# Patient Record
Sex: Female | Born: 1985 | Race: White | Hispanic: No | Marital: Single | Smoking: Former smoker
Health system: Southern US, Community
[De-identification: ages and names within clinical notes are randomized; demographics above are authoritative.]

## PROBLEM LIST (undated history)

## (undated) DIAGNOSIS — F32A Depression, unspecified: Secondary | ICD-10-CM

## (undated) DIAGNOSIS — G43909 Migraine, unspecified, not intractable, without status migrainosus: Secondary | ICD-10-CM

## (undated) DIAGNOSIS — D509 Iron deficiency anemia, unspecified: Secondary | ICD-10-CM

## (undated) DIAGNOSIS — F419 Anxiety disorder, unspecified: Secondary | ICD-10-CM

## (undated) DIAGNOSIS — F329 Major depressive disorder, single episode, unspecified: Secondary | ICD-10-CM

## (undated) DIAGNOSIS — IMO0002 Reserved for concepts with insufficient information to code with codable children: Secondary | ICD-10-CM

## (undated) HISTORY — PX: WISDOM TOOTH EXTRACTION: SHX21

---
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
## 2006-04-03 ENCOUNTER — Other Ambulatory Visit: Admission: RE | Admit: 2006-04-03 | Discharge: 2006-04-03 | Payer: Self-pay | Admitting: Internal Medicine

## 2008-06-05 ENCOUNTER — Emergency Department (HOSPITAL_COMMUNITY): Admission: EM | Admit: 2008-06-05 | Discharge: 2008-06-05 | Payer: Self-pay | Admitting: Emergency Medicine

## 2008-06-06 ENCOUNTER — Inpatient Hospital Stay (HOSPITAL_COMMUNITY): Admission: AD | Admit: 2008-06-06 | Discharge: 2008-06-06 | Payer: Self-pay | Admitting: Obstetrics & Gynecology

## 2010-11-17 NOTE — L&D Delivery Note (Signed)
Delivery Note With pushing, pt with severe variables, but good progress.  Pt pushed well x apx .  At 3:54 AM a viable female was delivered via Vaginal, Spontaneous Delivery (Presentation: Left Occiput Anterior).  APGAR: 9, 9 ; weight 5 lb 4 oz (2381 g).   Placenta status: Intact, Spontaneous.  Cord: 3 vessels with the following complications: True Knot and nuchal cord.  Cord pH: 7.1  Anesthesia: Epidural  Episiotomy: None Lacerations: 2nd degree Suture Repair: 3.0 vicryl rapide Est. Blood Loss (mL): 300  Mom to postpartum.  Baby to nursery-stable  Br, POPs, A+, RI, pt desires circ at office for baby, d/w pt r/b/a .  BOVARD,Kaemon Barnett 07/13/2011, 4:21 AM

## 2011-01-23 LAB — RUBELLA ANTIBODY, IGM: Rubella: IMMUNE

## 2011-01-23 LAB — HEPATITIS B SURFACE ANTIGEN: Hepatitis B Surface Ag: NEGATIVE

## 2011-01-23 LAB — ABO/RH: RH Type: POSITIVE

## 2011-04-23 ENCOUNTER — Inpatient Hospital Stay (HOSPITAL_COMMUNITY)
Admission: AD | Admit: 2011-04-23 | Discharge: 2011-04-23 | Disposition: A | Discharge status: Home / Self Care | Payer: Medicaid Other | Source: Ambulatory Visit | Attending: Obstetrics and Gynecology | Admitting: Obstetrics and Gynecology

## 2011-04-23 DIAGNOSIS — O209 Hemorrhage in early pregnancy, unspecified: Secondary | ICD-10-CM

## 2011-04-23 LAB — URINALYSIS, ROUTINE W REFLEX MICROSCOPIC
Bilirubin Urine: NEGATIVE
Glucose, UA: NEGATIVE mg/dL
Hgb urine dipstick: NEGATIVE
Ketones, ur: NEGATIVE mg/dL
Nitrite: NEGATIVE
Protein, ur: NEGATIVE mg/dL
Specific Gravity, Urine: <1.005 — ABNORMAL LOW (ref 1.005–1.030)
Urobilinogen, UA: 0.2 mg/dL (ref 0.0–1.0)
pH: 6.5 (ref 5.0–8.0)

## 2011-04-23 LAB — WET PREP, GENITAL
Clue Cells Wet Prep HPF POC: NONE SEEN
Trich, Wet Prep: NONE SEEN
Yeast Wet Prep HPF POC: NONE SEEN

## 2011-04-24 LAB — GC/CHLAMYDIA PROBE AMP, GENITAL
Chlamydia, DNA Probe: NEGATIVE
GC Probe Amp, Genital: NEGATIVE

## 2011-06-30 LAB — STREP B DNA PROBE: GBS: NEGATIVE

## 2011-07-12 ENCOUNTER — Inpatient Hospital Stay (HOSPITAL_COMMUNITY): Payer: Medicaid Other | Admitting: Anesthesiology

## 2011-07-12 ENCOUNTER — Inpatient Hospital Stay (HOSPITAL_COMMUNITY)
Admission: AD | Admit: 2011-07-12 | Discharge: 2011-07-15 | DRG: 775 | Disposition: A | Discharge status: Home / Self Care | Payer: Medicaid Other | Source: Ambulatory Visit | Attending: Obstetrics and Gynecology | Admitting: Obstetrics and Gynecology

## 2011-07-12 ENCOUNTER — Encounter (HOSPITAL_COMMUNITY): Payer: Self-pay | Admitting: Anesthesiology

## 2011-07-12 ENCOUNTER — Encounter (HOSPITAL_COMMUNITY): Payer: Self-pay

## 2011-07-12 DIAGNOSIS — IMO0002 Reserved for concepts with insufficient information to code with codable children: Secondary | ICD-10-CM

## 2011-07-12 DIAGNOSIS — Z3490 Encounter for supervision of normal pregnancy, unspecified, unspecified trimester: Secondary | ICD-10-CM

## 2011-07-12 DIAGNOSIS — Z8669 Personal history of other diseases of the nervous system and sense organs: Secondary | ICD-10-CM

## 2011-07-12 HISTORY — DX: Reserved for concepts with insufficient information to code with codable children: IMO0002

## 2011-07-12 LAB — CBC
Hemoglobin: 12 g/dL (ref 12.0–15.0)
MCH: 27.7 pg (ref 26.0–34.0)
MCV: 84.1 fL (ref 78.0–100.0)
Platelets: 297 10*3/uL (ref 150–400)
RBC: 4.33 MIL/uL (ref 3.87–5.11)
WBC: 16.3 10*3/uL — ABNORMAL HIGH (ref 4.0–10.5)

## 2011-07-12 MED ORDER — OXYTOCIN 20 UNITS IN LACTATED RINGERS INFUSION - SIMPLE
1.0000 m[IU]/min | INTRAVENOUS | Status: DC
Start: 2011-07-12 — End: 2011-07-15
  Administered 2011-07-12: 2 m[IU]/min via INTRAVENOUS
  Administered 2011-07-13: 333 m[IU]/min via INTRAVENOUS

## 2011-07-12 MED ORDER — ONDANSETRON HCL 4 MG/2ML IJ SOLN: 4.0000 mg | Freq: Four times a day (QID) | INTRAMUSCULAR | Status: DC | PRN

## 2011-07-12 MED ORDER — LACTATED RINGERS IV SOLN
INTRAVENOUS | Status: DC
  Administered 2011-07-12 (×2): via INTRAVENOUS
  Administered 2011-07-12 (×2): 300 mL via INTRAVENOUS
  Administered 2011-07-12: 22:00:00 via INTRAVENOUS

## 2011-07-12 MED ORDER — FLEET ENEMA 7-19 GM/118ML RE ENEM: 1.0000 | ENEMA | RECTAL | Status: DC | PRN

## 2011-07-12 MED ORDER — LIDOCAINE HCL 1.5 % IJ SOLN
INTRAMUSCULAR | Status: DC | PRN
  Administered 2011-07-12: 2 mL via EPIDURAL
  Administered 2011-07-12 (×2): 5 mL via EPIDURAL

## 2011-07-12 MED ORDER — EPHEDRINE 5 MG/ML INJ
10.0000 mg | INTRAVENOUS | Status: DC | PRN
  Filled 2011-07-12: qty 4

## 2011-07-12 MED ORDER — DIPHENHYDRAMINE HCL 50 MG/ML IJ SOLN: 12.5000 mg | INTRAMUSCULAR | Status: DC | PRN

## 2011-07-12 MED ORDER — LACTATED RINGERS IV SOLN: 500.0000 mL | Freq: Once | INTRAVENOUS | Status: DC

## 2011-07-12 MED ORDER — LIDOCAINE HCL (PF) 1 % IJ SOLN
30.0000 mL | INTRAMUSCULAR | Status: DC | PRN
  Filled 2011-07-12: qty 30

## 2011-07-12 MED ORDER — IBUPROFEN 600 MG PO TABS
600.0000 mg | ORAL_TABLET | Freq: Four times a day (QID) | ORAL | Status: DC | PRN
  Filled 2011-07-12 (×5): qty 1

## 2011-07-12 MED ORDER — EPHEDRINE 5 MG/ML INJ
10.0000 mg | INTRAVENOUS | Status: DC | PRN
  Administered 2011-07-12: 10 mg via INTRAVENOUS
  Filled 2011-07-12 (×2): qty 4

## 2011-07-12 MED ORDER — CITRIC ACID-SODIUM CITRATE 334-500 MG/5ML PO SOLN
30.0000 mL | ORAL | Status: DC | PRN
  Administered 2011-07-13: 30 mL via ORAL

## 2011-07-12 MED ORDER — ACETAMINOPHEN 325 MG PO TABS: 650.0000 mg | ORAL_TABLET | ORAL | Status: DC | PRN

## 2011-07-12 MED ORDER — PHENYLEPHRINE 40 MCG/ML (10ML) SYRINGE FOR IV PUSH (FOR BLOOD PRESSURE SUPPORT)
80.0000 ug | PREFILLED_SYRINGE | INTRAVENOUS | Status: DC | PRN
  Filled 2011-07-12: qty 5

## 2011-07-12 MED ORDER — LACTATED RINGERS IV SOLN: 500.0000 mL | INTRAVENOUS | Status: DC | PRN

## 2011-07-12 MED ORDER — FENTANYL 2.5 MCG/ML BUPIVACAINE 1/10 % EPIDURAL INFUSION (WH - ANES)
14.0000 mL/h | INTRAMUSCULAR | Status: DC
  Administered 2011-07-12 – 2011-07-13 (×3): 14 mL/h via EPIDURAL
  Filled 2011-07-12 (×2): qty 60

## 2011-07-12 MED ORDER — OXYTOCIN BOLUS FROM INFUSION
500.0000 mL | Freq: Once | INTRAVENOUS | Status: DC
  Filled 2011-07-12: qty 500

## 2011-07-12 MED ORDER — OXYTOCIN 20 UNITS IN LACTATED RINGERS INFUSION - SIMPLE
125.0000 mL/h | INTRAVENOUS | Status: AC
  Filled 2011-07-12: qty 1000

## 2011-07-12 MED ORDER — OXYCODONE-ACETAMINOPHEN 5-325 MG PO TABS
2.0000 | ORAL_TABLET | ORAL | Status: DC | PRN
  Filled 2011-07-12 (×2): qty 1
  Filled 2011-07-12: qty 2
  Filled 2011-07-12: qty 1

## 2011-07-12 MED ORDER — TERBUTALINE SULFATE 1 MG/ML IJ SOLN: 0.2500 mg | Freq: Once | INTRAMUSCULAR | Status: AC | PRN

## 2011-07-12 MED ORDER — PRENATAL PLUS 27-1 MG PO TABS: 1.0000 | ORAL_TABLET | Freq: Every day | ORAL | Status: DC

## 2011-07-12 MED ORDER — FENTANYL 2.5 MCG/ML BUPIVACAINE 1/10 % EPIDURAL INFUSION (WH - ANES)
INTRAMUSCULAR | Status: AC
  Filled 2011-07-12: qty 60

## 2011-07-12 MED ORDER — BUTORPHANOL TARTRATE 2 MG/ML IJ SOLN: 1.0000 mg | INTRAMUSCULAR | Status: DC | PRN

## 2011-07-12 NOTE — Progress Notes (Signed)
G1P0 Presents to MAU with complaints of ROM at 0930- clear fluid.

## 2011-07-12 NOTE — Anesthesia Preprocedure Evaluation (Signed)
Anesthesia Evaluation  Name, MR# and DOB Patient awake  General Assessment Comment  Reviewed: Allergy & Precautions, H&P , NPO status , Patient's Chart, lab work & pertinent test results, reviewed documented beta blocker date and time   History of Anesthesia Complications Negative for: history of anesthetic complications  Airway Mallampati: I TM Distance: >3 FB Neck ROM: full    Dental  (+) Teeth Intact   Pulmonary  clear to auscultation  breath sounds clear to auscultation none    Cardiovascular regular Normal    Neuro/Psych   Headaches (migraines every other day)   Negative Psych ROS  GI/Hepatic/Renal negative GI ROS  negative Liver ROS  negative Renal ROS        Endo/Other  (+)   Morbid obesity  Abdominal   Musculoskeletal   Hematology negative hematology ROS (+)   Peds  Reproductive/Obstetrics (+) Pregnancy    Anesthesia Other Findings             Anesthesia Physical Anesthesia Plan  ASA: II  Anesthesia Plan: Epidural   Post-op Pain Management:    Induction:   Airway Management Planned:   Additional Equipment:   Intra-op Plan:   Post-operative Plan:   Informed Consent: I have reviewed the patients History and Physical, chart, labs and discussed the procedure including the risks, benefits and alternatives for the proposed anesthesia with the patient or authorized representative who has indicated his/her understanding and acceptance.     Plan Discussed with:   Anesthesia Plan Comments:         Anesthesia Quick Evaluation

## 2011-07-12 NOTE — Anesthesia Procedure Notes (Signed)
Epidural Patient location during procedure: OB Start time: 07/12/2011 9:17 PM Reason for block: procedure for pain  Staffing Performed by: anesthesiologist   Preanesthetic Checklist Completed: patient identified, site marked, surgical consent, pre-op evaluation, timeout performed, IV checked, risks and benefits discussed and monitors and equipment checked  Epidural Patient position: sitting Prep: site prepped and draped and DuraPrep Patient monitoring: continuous pulse ox and blood pressure Approach: midline Injection technique: LOR air  Needle:  Needle type: Tuohy  Needle gauge: 17 G Needle length: 9 cm Needle insertion depth: 6 cm Catheter type: closed end flexible Catheter size: 19 Gauge Catheter at skin depth: 11 cm Test dose: negative  Assessment Events: blood not aspirated, injection not painful, no injection resistance, negative IV test and no paresthesia  Additional Notes Discussed risk of headache, infection, bleeding, nerve injury and failed or incomplete block.  Patient voices understanding and wishes to proceed.

## 2011-07-12 NOTE — Progress Notes (Deleted)
Lori Roy is a 25 y.o. G1P0000 at [redacted]w[redacted]d by LMP c/w [place] admitted for rupture of membranes  Subjective: uncomf with ctx  Objective: BP 114/68  Pulse 86  Temp(Src) 98 F (36.7 C) (Oral)  Resp 20  Ht 5\' 2"  (1.575 m)  Wt 100.789 kg (222 lb 3.2 oz)  BMI 40.64 kg/m2      FHT:  FHR: 115-130 bpm, variability: moderate,  accelerations:  Present,  decelerations:  Present occ lates UC:   regular, every 2-3 minutes SVE:   Dilation: 2 Effacement (%): 90 Station: 0 Exam by::J BOVARD, MD  Labs: Lab Results  Component Value Date   WBC 16.3* 07/12/2011   HGB 12.0 07/12/2011   HCT 36.4 07/12/2011   MCV 84.1 07/12/2011   PLT 297 07/12/2011    Assessment / Plan: Spontaneous labor, progressing slowly.  IUPC placed w/o diff/comp  Labor: latent phase, on pitocin, 2mU Preeclampsia:  no signs or symptoms of toxicity Fetal Wellbeing:  Category II Pain Control:  Epidural - will have anesthesia place  Anticipated MOD:  NSVD  BOVARD,Moorea Boissonneault 07/12/2011, 9:11 PM

## 2011-07-12 NOTE — H&P (Signed)
  Subjective:  Lori Roy is a 25 y.o. G1 P0 female with EDC 07/26/11 at 38 +0/[redacted] weeks gestation by 13 wk [place], cwd of LMP, who is being admitted for SROM, confirmed in MAU.  Her current obstetrical history is significant for history of anemia and h/o common migraines.  Patient reports loss of clear fluid.   Fetal Movement: positive.    PMH: No past medical history on file.migraines  PSH: No past surgical history on file.  POBGYNHX:  OB History    Grav Para Term Preterm Abortions TAB SAB Ect Mult Living   1 0 0 0 0 0 0 0 0 0      no abnormal pap, no STDs  MEDS: Current facility-administered medications:prenatal vitamin w/FE, FA (PRENATAL 1 + 1) 27-1 MG tablet 1 tablet, 1 tablet, Oral, Daily, Sherron Monday, MD  ALL: No Known Allergies  SH:  History   Social History  . Marital Status: Single    Spouse Name: N/A    Number of Children: N/A  . Years of Education: N/A   Occupational History  . Not on file.   Social History Main Topics  . Smoking status: No,h/o  . Smokeless tobacco: Not on file  . Alcohol Use: no  . Drug Use: no  . Sexually Active: yes   Other Topics Concern  . Not on file   Social History Narrative  . No narrative on file    YN:WGNFAOZ, bipolar, breast cancer, DM, HTN, MI   Objective:   Vital signs in last 24 hours: Temp:  [97.7 F (36.5 C)] 97.7 F (36.5 C) (08/25 1630) Pulse Rate:  [85] 85  (08/25 1630) Resp:  [18] 18  (08/25 1630) BP: (136)/(89) 136/89 mmHg (08/25 1630) Weight:  [100.789 kg (222 lb 3.2 oz)] 222 lb 3.2 oz (100.789 kg) (08/25 1630)   General:   NAD  Lungs:   clear to auscultation bilaterally  Heart:   regular rate and rhythm  Abdomen:  soft, FNT  FHT: 115 BPM  Presentations: vertex  Cervix:    Dilation: 2cm at last check in office   Effacement:    Station:     Lab Review A+, Ab Scr neg, Hgb 11.1, Pap WNL, RI, RPR NR, HepBsAg neg, HIV neg, Plt 231 K, CF neg, First Tri Scr WNL, AFP WNL, glucola 115, nl bile acids, GBBS  neg   [place] 13 wk EDC 07/26/11, fundal plac [place] 18wk- normal anat, post plac, female  Assessment/Plan:  38 +0/[redacted] weeks gestation with SROM.      Risks, benefits, alternatives and possible complications have been discussed in detail with the patient.  Pre-admission, admission, and post admission procedures and expectations were discussed in detail.  All questions answered, all appropriate consents will be signed at the Hospital. Admission is planned for today. GBBS neg, pitocin to augment, epidural or IV pain meds prn    Carmelina Noun, MD

## 2011-07-12 NOTE — Progress Notes (Signed)
Dr. Ellyn Hack notified of positive fern in MAU. Ruptured membranes at 0930- at home. Clear fluid. Pt had cervix checked in office Monday she was 2 cm.

## 2011-07-12 NOTE — Progress Notes (Signed)
Has felt clear liquid coming out all day wearing a pad.

## 2011-07-13 ENCOUNTER — Encounter (HOSPITAL_COMMUNITY): Payer: Self-pay | Admitting: Obstetrics and Gynecology

## 2011-07-13 LAB — RPR: RPR Ser Ql: NONREACTIVE

## 2011-07-13 MED ORDER — ONDANSETRON HCL 4 MG PO TABS: 4.0000 mg | ORAL_TABLET | ORAL | Status: DC | PRN

## 2011-07-13 MED ORDER — OXYCODONE-ACETAMINOPHEN 5-325 MG PO TABS
1.0000 | ORAL_TABLET | ORAL | Status: DC | PRN
  Administered 2011-07-13 (×3): 1 via ORAL

## 2011-07-13 MED ORDER — SIMETHICONE 80 MG PO CHEW: 80.0000 mg | CHEWABLE_TABLET | ORAL | Status: DC | PRN

## 2011-07-13 MED ORDER — LANOLIN HYDROUS EX OINT: TOPICAL_OINTMENT | CUTANEOUS | Status: DC | PRN

## 2011-07-13 MED ORDER — SODIUM CHLORIDE 0.9 % IJ SOLN: 3.0000 mL | Freq: Two times a day (BID) | INTRAMUSCULAR | Status: DC

## 2011-07-13 MED ORDER — OXYTOCIN 20 UNITS IN LACTATED RINGERS INFUSION - SIMPLE: 125.0000 mL/h | INTRAVENOUS | Status: DC | PRN

## 2011-07-13 MED ORDER — DIPHENHYDRAMINE HCL 25 MG PO CAPS: 25.0000 mg | ORAL_CAPSULE | Freq: Four times a day (QID) | ORAL | Status: DC | PRN

## 2011-07-13 MED ORDER — SODIUM CHLORIDE 0.9 % IJ SOLN: 3.0000 mL | INTRAMUSCULAR | Status: DC | PRN

## 2011-07-13 MED ORDER — ONDANSETRON HCL 4 MG/2ML IJ SOLN: 4.0000 mg | INTRAMUSCULAR | Status: DC | PRN

## 2011-07-13 MED ORDER — SENNOSIDES-DOCUSATE SODIUM 8.6-50 MG PO TABS
2.0000 | ORAL_TABLET | Freq: Every day | ORAL | Status: DC
  Administered 2011-07-13 – 2011-07-14 (×2): 2 via ORAL

## 2011-07-13 MED ORDER — TETANUS-DIPHTH-ACELL PERTUSSIS 5-2.5-18.5 LF-MCG/0.5 IM SUSP
0.5000 mL | Freq: Once | INTRAMUSCULAR | Status: AC
  Administered 2011-07-14: 0.5 mL via INTRAMUSCULAR
  Filled 2011-07-13: qty 0.5

## 2011-07-13 MED ORDER — SODIUM CHLORIDE 0.9 % IV SOLN: 250.0000 mL | INTRAVENOUS | Status: DC

## 2011-07-13 MED ORDER — LACTATED RINGERS IV SOLN
INTRAVENOUS | Status: DC
Start: 2011-07-13 — End: 2011-07-15

## 2011-07-13 MED ORDER — ZOLPIDEM TARTRATE 5 MG PO TABS: 5.0000 mg | ORAL_TABLET | Freq: Every evening | ORAL | Status: DC | PRN

## 2011-07-13 MED ORDER — IBUPROFEN 600 MG PO TABS
600.0000 mg | ORAL_TABLET | Freq: Four times a day (QID) | ORAL | Status: DC
  Administered 2011-07-13 – 2011-07-15 (×9): 600 mg via ORAL
  Filled 2011-07-13 (×4): qty 1

## 2011-07-13 MED ORDER — BENZOCAINE-MENTHOL 20-0.5 % EX AERO: 1.0000 "application " | INHALATION_SPRAY | CUTANEOUS | Status: DC | PRN

## 2011-07-13 MED ORDER — CITRIC ACID-SODIUM CITRATE 334-500 MG/5ML PO SOLN
ORAL | Status: AC
  Filled 2011-07-13: qty 15

## 2011-07-13 MED ORDER — DIBUCAINE 1 % RE OINT: 1.0000 "application " | TOPICAL_OINTMENT | RECTAL | Status: DC | PRN

## 2011-07-13 MED ORDER — BENZOCAINE-MENTHOL 20-0.5 % EX AERO
INHALATION_SPRAY | CUTANEOUS | Status: AC
  Administered 2011-07-13: 07:00:00
  Filled 2011-07-13: qty 56

## 2011-07-13 MED ORDER — PRENATAL PLUS 27-1 MG PO TABS
1.0000 | ORAL_TABLET | Freq: Every day | ORAL | Status: DC
  Administered 2011-07-13 – 2011-07-15 (×3): 1 via ORAL
  Filled 2011-07-13 (×3): qty 1

## 2011-07-13 MED ORDER — WITCH HAZEL-GLYCERIN EX PADS: 1.0000 "application " | MEDICATED_PAD | CUTANEOUS | Status: DC | PRN

## 2011-07-13 NOTE — Progress Notes (Signed)
Post Partum Day 0 Subjective: no complaints and nl lochia, pain controlled  Objective: Blood pressure 120/74, pulse 78, temperature 98 F (36.7 C), temperature source Oral, resp. rate 18, height 5\' 2"  (1.575 m), weight 100.789 kg (222 lb 3.2 oz), SpO2 100.00%, unknown if currently breastfeeding.  Physical Exam:  General: alert and no distress Lochia: appropriate Uterine Fundus: firm  DVT Evaluation: No evidence of DVT seen on physical exam.   Basename 07/12/11 1550  HGB 12.0  HCT 36.4    Assessment/Plan: Plan for discharge tomorrow or Tuesday.  Routine care   LOS: 1 day   Roy,Lori Goodall 07/13/2011, 10:27 AM

## 2011-07-13 NOTE — Progress Notes (Signed)
Lori Roy is a 25 y.o. G1P0000 at [redacted]w[redacted]d by LMP admitted for rupture of membranes  Subjective: Comf with epidural  Objective: BP 108/57  Pulse 67  Temp(Src) 98.4 F (36.9 C) (Oral)  Resp 20  Ht 5\' 2"  (1.575 m)  Wt 100.789 kg (222 lb 3.2 oz)  BMI 40.64 kg/m2  SpO2 100%     gen NAD FHT:  Now 120's, scalp stim with SVE; brady to 80's x 10 - , good variability during brady UC:   regular, every 2-4 minutes SVE:   Dilation: 4.5 Effacement (%): 90 Station: 0 Exam by:: Dr. Ellyn Hack  Labs: Lab Results  Component Value Date   WBC 16.3* 07/12/2011   HGB 12.0 07/12/2011   HCT 36.4 07/12/2011   MCV 84.1 07/12/2011   PLT 297 07/12/2011    Assessment / Plan: Augmentation of labor,making some cervical change, starting to enter active phase.  Labor: progressing slowly, with some intolerance of labor Preeclampsia:  no signs or symptoms of toxicity Fetal Wellbeing:  Category III Pain Control:  Epidural  Anticipated MOD:  NSVD  vs LTCS, will monitor closely; briefly discussed likelihood of LTCS, will continue to watch closely now.    BOVARD,Annaleigh Steinmeyer 07/13/2011, 1:25 AM

## 2011-07-13 NOTE — Progress Notes (Signed)
Lori Roy is a 25 y.o. G1P0000 at [redacted]w[redacted]d by LMP admitted for rupture of membranes  Subjective: comf with epidural  Objective: BP 111/51  Pulse 80  Temp(Src) 98.4 F (36.9 C) (Oral)  Resp 20  Ht 5\' 2"  (1.575 m)  Wt 100.789 kg (222 lb 3.2 oz)  BMI 40.64 kg/m2  SpO2 100%     gen NAD FHT:  FHR: 120's bpm, variability: moderate,  accelerations:  Present,  decelerations:  Present variables, occ lates, good variability UC:   regular, every 2-3 minutes SVE:   Dilation: 3 Effacement (%): 90 Station: 0 Exam by:: Verne Grain, RN  Labs: Lab Results  Component Value Date   WBC 16.3* 07/12/2011   HGB 12.0 07/12/2011   HCT 36.4 07/12/2011   MCV 84.1 07/12/2011   PLT 297 07/12/2011    Assessment / Plan: Augmentation of labor, progressing slowly  Labor: Progressing slowly, will restart Pitocin, will continue close monitoring Preeclampsia:  no signs or symptoms of toxicity Fetal Wellbeing:  Category II Pain Control:  Epidural  Anticipated MOD:  NSVD  BOVARD,Jarquis Walker 07/13/2011, 12:07 AM

## 2011-07-14 LAB — CBC
MCHC: 31.8 g/dL (ref 30.0–36.0)
MCV: 85.1 fL (ref 78.0–100.0)
Platelets: 221 10*3/uL (ref 150–400)
RDW: 14.6 % (ref 11.5–15.5)
WBC: 9.6 10*3/uL (ref 4.0–10.5)

## 2011-07-14 NOTE — Progress Notes (Signed)
Post Partum Day 1 Subjective: no complaints, tolerating PO and nl lochia, pain controlled  Objective: Blood pressure 137/93, pulse 82, temperature 98.2 F (36.8 C), temperature source Oral, resp. rate 18, height 5\' 2"  (1.575 m), weight 100.789 kg (222 lb 3.2 oz), SpO2 97.00%, unknown if currently breastfeeding.  Physical Exam:  General: alert and no distress Lochia: appropriate Uterine Fundus: firm  DVT Evaluation: No evidence of DVT seen on physical exam.   Basename 07/14/11 0510 07/12/11 1550  HGB 9.6* 12.0  HCT 30.2* 36.4    Assessment/Plan: Plan for discharge tomorrow  Doing well.   LOS: 2 days   BOVARD,Eugine Bubb 07/14/2011, 8:03 AM

## 2011-07-14 NOTE — Progress Notes (Signed)
UR chart review completed.  

## 2011-07-15 MED ORDER — PRENATAL PLUS 27-1 MG PO TABS: 1.0000 | ORAL_TABLET | Freq: Every day | ORAL | Status: DC

## 2011-07-15 MED ORDER — IBUPROFEN 600 MG PO TABS: 600.0000 mg | ORAL_TABLET | Freq: Four times a day (QID) | ORAL | Status: AC

## 2011-07-15 MED ORDER — OXYCODONE-ACETAMINOPHEN 5-325 MG PO TABS: 1.0000 | ORAL_TABLET | ORAL | Status: AC | PRN

## 2011-07-15 NOTE — Anesthesia Postprocedure Evaluation (Signed)
Patient stable following vaginal delivery.  

## 2011-07-15 NOTE — Progress Notes (Signed)
Post Partum Day 2 Subjective: no complaints and pain controlled, nl lochia  Objective: Blood pressure 129/83, pulse 75, temperature 97.9 F (36.6 C), temperature source Oral, resp. rate 18, height 5\' 2"  (1.575 m), weight 100.789 kg (222 lb 3.2 oz), SpO2 97.00%, unknown if currently breastfeeding.  Physical Exam:  General: alert and no distress Lochia: appropriate Uterine Fundus: firm   Basename 07/14/11 0510 07/12/11 1550  HGB 9.6* 12.0  HCT 30.2* 36.4    Assessment/Plan: Discharge home.  D/C with Motrin, Percocet, PNV, f/u 6 weeks   LOS: 3 days   BOVARD,Kailyn Dubie 07/15/2011, 9:02 AM

## 2011-07-15 NOTE — Discharge Summary (Signed)
Obstetric Discharge Summary Reason for Admission: rupture of membranes Prenatal Procedures: none Intrapartum Procedures: spontaneous vaginal delivery Postpartum Procedures: none Complications-Operative and Postpartum: 2nd degree perineal laceration Hemoglobin  Date Value Range Status  07/14/2011 9.6* 12.0-15.0 (g/dL) Final     DELTA CHECK NOTED     REPEATED TO VERIFY     HCT  Date Value Range Status  07/14/2011 30.2* 36.0-46.0 (%) Final    Discharge Diagnoses: Term Pregnancy-delivered  Discharge Information: Date: 07/15/2011 Activity: pelvic rest Diet: routine Medications: PNV, Ibuprophen, Iron and Percocet Condition: stable Instructions: refer to practice specific booklet Discharge to: home Follow-up Information    Follow up with BOVARD,Corine Solorio, MD. Make an appointment in 6 weeks. (for Postpartum Check)    Contact information:   [address] [facility] [address] [place] [address] [phone]          Newborn Data: Live born female  Birth Weight: 5 lb 4 oz (2381 g) APGAR: 9, 9  Home with mother.  BOVARD,Ronisha Herringshaw 07/15/2011, 9:09 AM

## 2011-08-15 LAB — CBC
Hemoglobin: 11.3 — ABNORMAL LOW
RDW: 15.8 — ABNORMAL HIGH

## 2011-08-15 LAB — GC/CHLAMYDIA PROBE AMP, GENITAL: Chlamydia, DNA Probe: NEGATIVE

## 2011-08-15 LAB — DIFFERENTIAL
Basophils Absolute: 0
Lymphocytes Relative: 26
Monocytes Absolute: 0.4
Neutro Abs: 7.4
Neutrophils Relative %: 68

## 2011-08-15 LAB — WET PREP, GENITAL: Clue Cells Wet Prep HPF POC: NONE SEEN

## 2011-08-15 LAB — HCG, QUANTITATIVE, PREGNANCY: hCG, Beta Chain, Quant, S: <2

## 2011-11-27 ENCOUNTER — Emergency Department (INDEPENDENT_AMBULATORY_CARE_PROVIDER_SITE_OTHER)
Admission: EM | Admit: 2011-11-27 | Discharge: 2011-11-27 | Disposition: A | Discharge status: Home / Self Care | Payer: Self-pay | Source: Home / Self Care | Attending: Family Medicine | Admitting: Family Medicine

## 2011-11-27 ENCOUNTER — Encounter (HOSPITAL_COMMUNITY): Payer: Self-pay | Admitting: Emergency Medicine

## 2011-11-27 DIAGNOSIS — G43509 Persistent migraine aura without cerebral infarction, not intractable, without status migrainosus: Secondary | ICD-10-CM

## 2011-11-27 MED ORDER — ONDANSETRON 4 MG PO TBDP
ORAL_TABLET | ORAL | Status: AC
  Filled 2011-11-27: qty 1

## 2011-11-27 MED ORDER — DIPHENHYDRAMINE HCL 50 MG/ML IJ SOLN
INTRAMUSCULAR | Status: AC
  Filled 2011-11-27: qty 1

## 2011-11-27 MED ORDER — KETOROLAC TROMETHAMINE 30 MG/ML IJ SOLN
INTRAMUSCULAR | Status: AC
  Filled 2011-11-27: qty 1

## 2011-11-27 MED ORDER — DIPHENHYDRAMINE HCL 50 MG/ML IJ SOLN: 50.0000 mg | Freq: Once | INTRAMUSCULAR | Status: DC

## 2011-11-27 MED ORDER — ONDANSETRON 4 MG PO TBDP: 4.0000 mg | ORAL_TABLET | Freq: Once | ORAL | Status: DC

## 2011-11-27 MED ORDER — KETOROLAC TROMETHAMINE 30 MG/ML IJ SOLN: 30.0000 mg | Freq: Once | INTRAMUSCULAR | Status: DC

## 2011-11-27 NOTE — ED Provider Notes (Signed)
History     CSN: [id number]  Arrival date & time 11/27/11  1401   First MD Initiated Contact with Patient 11/27/11 1420      Chief Complaint  Patient presents with  . Migraine    (Consider location/radiation/quality/duration/timing/severity/associated sxs/prior treatment) Patient is a 26 y.o. female presenting with migraine. The history is provided by the patient.  Migraine This is a chronic problem. The current episode started yesterday. The problem has been gradually worsening. Associated symptoms include headaches. The symptoms are relieved by nothing. She has tried acetaminophen for the symptoms. The treatment provided no relief.    Past Medical History  Diagnosis Date  . Normal pregnancy 07/12/2011  . SROM (spontaneous rupture of membranes) 07/12/2011  . History of migraines   . SVD (spontaneous vaginal delivery) 07/13/2011    Past Surgical History  Procedure Date  . Wisdom tooth extraction     History reviewed. No pertinent family history.  History  Substance Use Topics  . Smoking status: Never Smoker   . Smokeless tobacco: Never Used  . Alcohol Use: No    OB History    Grav Para Term Preterm Abortions TAB SAB Ect Mult Living   2 1 1  0 0 0 0 0 0 1      Review of Systems  Constitutional: Negative.   Eyes: Negative.   Gastrointestinal: Negative.   Genitourinary: Negative.   Neurological: Positive for headaches. Negative for dizziness and numbness.    Allergies  Review of patient's allergies indicates no known allergies.  Home Medications   Current Outpatient Rx  Name Route Sig Dispense Refill  . FERROUS GLUCONATE 325 MG PO TABS Oral Take 325 mg by mouth daily with breakfast.      . PRENATAL PLUS 27-1 MG PO TABS Oral Take 1 tablet by mouth daily.      Marland Kitchen PRENATAL PLUS 27-1 MG PO TABS Oral Take 1 tablet by mouth daily. 100 each 3    BP 135/91  Pulse 62  Temp(Src) 97.8 F (36.6 C) (Oral)  Resp 16  SpO2 100%  LMP 11/27/2011  Breastfeeding?  No  Physical Exam  Nursing note and vitals reviewed. Constitutional: She is oriented to person, place, and time. She appears well-developed and well-nourished.  HENT:  Head: Normocephalic.  Right Ear: External ear normal.  Left Ear: External ear normal.  Mouth/Throat: Oropharynx is clear and moist.  Eyes: Conjunctivae and EOM are normal. Pupils are equal, round, and reactive to light.  Neck: Normal range of motion. Neck supple.  Cardiovascular: Normal rate, normal heart sounds and intact distal pulses.   Pulmonary/Chest: Effort normal and breath sounds normal.  Abdominal: Soft. Bowel sounds are normal.  Lymphadenopathy:    She has no cervical adenopathy.  Neurological: She is alert and oriented to person, place, and time.  Skin: Skin is warm and dry.  Psychiatric: She has a normal mood and affect.    ED Course  Procedures (including critical care time)  Labs Reviewed - No data to display No results found.   1. Migraine aura, persistent       MDM          Barkley Bruns, MD 11/27/11 1614

## 2011-11-27 NOTE — ED Notes (Signed)
Pt c/o migraine pain starting last night. Pt states she has had migraines in the past, but is not on a daily medication. Pt c/o stomach pain when taking pills, states tylenol and ibuprofen not helping. Pt weeping from pain level of a 10.

## 2012-05-17 ENCOUNTER — Emergency Department (HOSPITAL_COMMUNITY): Payer: No Typology Code available for payment source

## 2012-05-17 ENCOUNTER — Encounter (HOSPITAL_COMMUNITY): Payer: Self-pay | Admitting: Emergency Medicine

## 2012-05-17 ENCOUNTER — Emergency Department (HOSPITAL_COMMUNITY)
Admission: EM | Admit: 2012-05-17 | Discharge: 2012-05-17 | Disposition: A | Discharge status: Home / Self Care | Payer: No Typology Code available for payment source | Attending: Emergency Medicine | Admitting: Emergency Medicine

## 2012-05-17 DIAGNOSIS — T1490XA Injury, unspecified, initial encounter: Secondary | ICD-10-CM | POA: Insufficient documentation

## 2012-05-17 DIAGNOSIS — R109 Unspecified abdominal pain: Secondary | ICD-10-CM | POA: Insufficient documentation

## 2012-05-17 DIAGNOSIS — M25579 Pain in unspecified ankle and joints of unspecified foot: Secondary | ICD-10-CM | POA: Insufficient documentation

## 2012-05-17 DIAGNOSIS — M25519 Pain in unspecified shoulder: Secondary | ICD-10-CM | POA: Insufficient documentation

## 2012-05-17 DIAGNOSIS — N390 Urinary tract infection, site not specified: Secondary | ICD-10-CM | POA: Insufficient documentation

## 2012-05-17 DIAGNOSIS — R079 Chest pain, unspecified: Secondary | ICD-10-CM | POA: Insufficient documentation

## 2012-05-17 DIAGNOSIS — M25569 Pain in unspecified knee: Secondary | ICD-10-CM | POA: Insufficient documentation

## 2012-05-17 LAB — PREGNANCY, URINE: Preg Test, Ur: NEGATIVE

## 2012-05-17 LAB — URINALYSIS, ROUTINE W REFLEX MICROSCOPIC
Hgb urine dipstick: NEGATIVE
Nitrite: NEGATIVE
Specific Gravity, Urine: 1.018 (ref 1.005–1.030)
Urobilinogen, UA: 1 mg/dL (ref 0.0–1.0)

## 2012-05-17 LAB — URINE MICROSCOPIC-ADD ON

## 2012-05-17 MED ORDER — DIAZEPAM 5 MG PO TABS: 5.0000 mg | ORAL_TABLET | Freq: Three times a day (TID) | ORAL | Status: DC | PRN

## 2012-05-17 MED ORDER — NITROFURANTOIN MONOHYD MACRO 100 MG PO CAPS: 100.0000 mg | ORAL_CAPSULE | Freq: Two times a day (BID) | ORAL | Status: DC

## 2012-05-17 MED ORDER — HYDROCODONE-ACETAMINOPHEN 5-325 MG PO TABS
1.0000 | ORAL_TABLET | Freq: Once | ORAL | Status: AC
  Administered 2012-05-17: 1 via ORAL
  Filled 2012-05-17: qty 1

## 2012-05-17 MED ORDER — IBUPROFEN 400 MG PO TABS
400.0000 mg | ORAL_TABLET | Freq: Once | ORAL | Status: AC
  Administered 2012-05-17: 400 mg via ORAL
  Filled 2012-05-17: qty 1

## 2012-05-17 MED ORDER — HYDROCODONE-ACETAMINOPHEN 5-325 MG PO TABS: 1.0000 | ORAL_TABLET | Freq: Four times a day (QID) | ORAL | Status: DC | PRN

## 2012-05-17 MED ORDER — IBUPROFEN 800 MG PO TABS: 800.0000 mg | ORAL_TABLET | Freq: Three times a day (TID) | ORAL | Status: DC

## 2012-05-17 MED ORDER — IOHEXOL 300 MG/ML  SOLN
100.0000 mL | Freq: Once | INTRAMUSCULAR | Status: AC | PRN
  Administered 2012-05-17: 100 mL via INTRAVENOUS

## 2012-05-17 NOTE — ED Provider Notes (Signed)
Medical screening examination/treatment/procedure(s) were performed by non-physician practitioner and as supervising physician I was immediately available for consultation/collaboration.   [name], MD 05/17/12 [phone]
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
## 2012-05-21 ENCOUNTER — Encounter (HOSPITAL_COMMUNITY): Payer: Self-pay | Admitting: Physical Medicine and Rehabilitation

## 2012-05-21 ENCOUNTER — Emergency Department (HOSPITAL_COMMUNITY)
Admission: EM | Admit: 2012-05-21 | Discharge: 2012-05-21 | Disposition: A | Discharge status: Home / Self Care | Payer: No Typology Code available for payment source | Attending: Emergency Medicine | Admitting: Emergency Medicine

## 2012-05-21 DIAGNOSIS — R109 Unspecified abdominal pain: Secondary | ICD-10-CM | POA: Insufficient documentation

## 2012-05-21 DIAGNOSIS — M549 Dorsalgia, unspecified: Secondary | ICD-10-CM | POA: Insufficient documentation

## 2012-05-21 MED ORDER — OXYCODONE-ACETAMINOPHEN 5-325 MG PO TABS
1.0000 | ORAL_TABLET | Freq: Once | ORAL | Status: AC
  Administered 2012-05-21: 1 via ORAL
  Filled 2012-05-21: qty 1

## 2012-05-21 MED ORDER — OXYCODONE-ACETAMINOPHEN 5-325 MG PO TABS: 1.0000 | ORAL_TABLET | Freq: Four times a day (QID) | ORAL | Status: AC | PRN
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
## 2013-02-21 ENCOUNTER — Emergency Department (HOSPITAL_COMMUNITY): Payer: Self-pay

## 2013-02-21 ENCOUNTER — Emergency Department (HOSPITAL_COMMUNITY)
Admission: EM | Admit: 2013-02-21 | Discharge: 2013-02-21 | Disposition: A | Discharge status: Home / Self Care | Payer: Self-pay | Attending: Emergency Medicine | Admitting: Emergency Medicine

## 2013-02-21 ENCOUNTER — Encounter (HOSPITAL_COMMUNITY): Payer: Self-pay | Admitting: *Deleted

## 2013-02-21 DIAGNOSIS — Z8679 Personal history of other diseases of the circulatory system: Secondary | ICD-10-CM | POA: Insufficient documentation

## 2013-02-21 DIAGNOSIS — Y9289 Other specified places as the place of occurrence of the external cause: Secondary | ICD-10-CM | POA: Insufficient documentation

## 2013-02-21 DIAGNOSIS — Y9301 Activity, walking, marching and hiking: Secondary | ICD-10-CM | POA: Insufficient documentation

## 2013-02-21 DIAGNOSIS — IMO0002 Reserved for concepts with insufficient information to code with codable children: Secondary | ICD-10-CM | POA: Insufficient documentation

## 2013-02-21 DIAGNOSIS — X58XXXA Exposure to other specified factors, initial encounter: Secondary | ICD-10-CM | POA: Insufficient documentation

## 2013-02-21 MED ORDER — SULFAMETHOXAZOLE-TRIMETHOPRIM 800-160 MG PO TABS: 1.0000 | ORAL_TABLET | Freq: Two times a day (BID) | ORAL | Status: DC

## 2013-02-21 MED ORDER — HYDROCODONE-ACETAMINOPHEN 5-325 MG PO TABS: 1.0000 | ORAL_TABLET | ORAL | Status: DC | PRN

## 2013-02-21 NOTE — ED Notes (Signed)
Pt is here with left foot blister/knot to heel with pain.  No dm.  Pt states pain runs up leg

## 2013-02-21 NOTE — ED Provider Notes (Signed)
History  This chart was scribed for non-physician practitioner working with Lori Octave, MD by Lori Roy, ED Scribe. This patient was seen in room TR08C/TR08C and the patient's care was started at 17:11.   CSN: [id number]  Arrival date & time 02/21/13  1711   First MD Initiated Contact with Patient 02/21/13 1729      Chief Complaint  Patient presents with  . Blister  . Foot Pain    (Consider location/radiation/quality/duration/timing/severity/associated sxs/prior treatment) The history is provided by the patient. No language interpreter was used.  Lori Roy is a 27 y.o. female who presents to the Emergency Department complaining of a blister to her left foot that is very TTP. Pt reports about a month ago she was doing a lot of walking on the beach in a new pair of sandals and she got several blisters on her feet. All of the blisters disappeared, but then this one has persisted.  Denies any associated drainage.  Has been walking abnormally to take the pressure off her left foot which is now causing left ankle and knee pain. She works 10 hours/day at work and is on her feet the whole time.  Has been soaking her feet daily for the past week with mild improvement.  Taking OTC pain meds without significant relief.  Past Medical History  Diagnosis Date  . Normal pregnancy 07/12/2011  . SROM (spontaneous rupture of membranes) 07/12/2011  . History of migraines   . SVD (spontaneous vaginal delivery) 07/13/2011    Past Surgical History  Procedure Laterality Date  . Wisdom tooth extraction      No family history on file.  History  Substance Use Topics  . Smoking status: Never Smoker   . Smokeless tobacco: Never Used  . Alcohol Use: No    OB History   Grav Para Term Preterm Abortions TAB SAB Ect Mult Living   2 1 1  0 0 0 0 0 0 1      Review of Systems  Constitutional: Negative for fever.  Skin: Positive for wound.       Left foot blister  All other systems  reviewed and are negative.    Allergies  Review of patient's allergies indicates no known allergies.  Home Medications   Current Outpatient Rx  Name  Route  Sig  Dispense  Refill  . acetaminophen (TYLENOL) 500 MG tablet   Oral   Take 1,000 mg by mouth every 6 (six) hours as needed for pain.         . diphenhydrAMINE (BENADRYL) 25 MG tablet   Oral   Take 25 mg by mouth every 6 (six) hours as needed for itching or allergies.         Marland Kitchen neomycin-bacitracin-polymyxin (NEOSPORIN) ointment   Topical   Apply 1 application topically every 12 (twelve) hours as needed (to foot for infection prevention). apply to eye           Triage Vitals: BP 141/92  Pulse 94  Temp(Src) 97.6 F (36.4 C) (Oral)  Resp 18  SpO2 100%  LMP 02/16/2013  Physical Exam  Nursing note and vitals reviewed. Constitutional: She is oriented to person, place, and time. She appears well-developed and well-nourished.  HENT:  Head: Normocephalic and atraumatic.  Eyes: Conjunctivae and EOM are normal.  Neck: Normal range of motion. Neck supple.  Cardiovascular: Normal rate, regular rhythm and normal heart sounds.   Pulmonary/Chest: Effort normal and breath sounds normal.  Musculoskeletal: Normal range of motion.  Feet:  Neurological: She is alert and oriented to person, place, and time.  Skin: Skin is warm and dry.  Psychiatric: She has a normal mood and affect.    ED Course  Procedures (including critical care time) DIAGNOSTIC STUDIES: Oxygen Saturation is 100% on room air, normal by my interpretation.    COORDINATION OF CARE: 17:30--I evaluated the patient and we discussed a treatment plan including x-ray and possible incision and drinage to which the pt agreed.   19:00--I rechecked the pt. She does not want any incision and drainage.   Labs Reviewed - No data to display Dg Foot Complete Left  02/21/2013  *RADIOLOGY REPORT*  Clinical Data: Left foot pain.  LEFT FOOT - COMPLETE 3+ VIEW   Comparison:  None.  Findings:  There is no evidence of fracture or dislocation.  There is no evidence of arthropathy or other focal bone abnormality. Soft tissues are unremarkable.  IMPRESSION: Negative.   Original Report Authenticated By: Lori Roy, M.D.      1. Blister of foot or toe, left, initial encounter       MDM   Blister with overlying callus on lateral left foot with increased swelling and erythema from prolonged standing at work.  Sx improved with foot soaks over the past week but still having pain.  X-ray without evidence of osteomyelitis.  Pt does not want I&D.  Rx bactrim and vicodin.  If not improved in the next few days, FU with wound clinic for debridement.  Pt agrees with plan.  Return precautions advised.  I personally performed the services described in this documentation, which was scribed in my presence. The recorded information has been reviewed and is accurate.    Garlon Hatchet, PA-C 02/22/13 1231

## 2013-02-23 NOTE — ED Provider Notes (Signed)
Medical screening examination/treatment/procedure(s) were performed by non-physician practitioner and as supervising physician I was immediately available for consultation/collaboration.  Miranda Garber, MD 02/23/13 1005 

## 2013-03-21 ENCOUNTER — Emergency Department (HOSPITAL_COMMUNITY): Payer: Self-pay

## 2013-03-21 ENCOUNTER — Encounter (HOSPITAL_COMMUNITY): Payer: Self-pay | Admitting: *Deleted

## 2013-03-21 ENCOUNTER — Emergency Department (HOSPITAL_COMMUNITY)
Admission: EM | Admit: 2013-03-21 | Discharge: 2013-03-21 | Disposition: A | Discharge status: Home / Self Care | Payer: Self-pay | Attending: Emergency Medicine | Admitting: Emergency Medicine

## 2013-03-21 DIAGNOSIS — Y9301 Activity, walking, marching and hiking: Secondary | ICD-10-CM | POA: Insufficient documentation

## 2013-03-21 DIAGNOSIS — Z8679 Personal history of other diseases of the circulatory system: Secondary | ICD-10-CM | POA: Insufficient documentation

## 2013-03-21 DIAGNOSIS — S92919A Unspecified fracture of unspecified toe(s), initial encounter for closed fracture: Secondary | ICD-10-CM | POA: Insufficient documentation

## 2013-03-21 DIAGNOSIS — S92912A Unspecified fracture of left toe(s), initial encounter for closed fracture: Secondary | ICD-10-CM

## 2013-03-21 DIAGNOSIS — W010XXA Fall on same level from slipping, tripping and stumbling without subsequent striking against object, initial encounter: Secondary | ICD-10-CM | POA: Insufficient documentation

## 2013-03-21 DIAGNOSIS — IMO0002 Reserved for concepts with insufficient information to code with codable children: Secondary | ICD-10-CM | POA: Insufficient documentation

## 2013-03-21 DIAGNOSIS — Y9289 Other specified places as the place of occurrence of the external cause: Secondary | ICD-10-CM | POA: Insufficient documentation

## 2013-03-21 MED ORDER — ACETAMINOPHEN 500 MG PO TABS
1000.0000 mg | ORAL_TABLET | Freq: Once | ORAL | Status: AC
  Administered 2013-03-21: 1000 mg via ORAL
  Filled 2013-03-21: qty 2

## 2013-03-21 MED ORDER — HYDROCODONE-ACETAMINOPHEN 5-325 MG PO TABS: 1.0000 | ORAL_TABLET | Freq: Three times a day (TID) | ORAL | Status: DC | PRN

## 2013-03-21 NOTE — ED Provider Notes (Signed)
History  This chart was scribed for Gerhard Munch, MD by Ardeen Jourdain, ED Scribe. This patient was seen in room TR08C/TR08C and the patient's care was started at 2136.  CSN: [id number]  Arrival date & time 03/21/13  1920   None     Chief Complaint  Patient presents with  . Toe Injury     The history is provided by the patient. No language interpreter was used.    Lori Roy is a 27 y.o. female who presents to the Emergency Department complaining of sudden onset, gradually worsening, constant left great toe pain that began 8 hours ago. Pt states she was walking at work when she slipped causing her toe to hit a metal stair. She states she is able to ambulate. She describes the pain as a throbbing sensation. She denies any other injuries or complaints at this time.     Past Medical History  Diagnosis Date  . Normal pregnancy 07/12/2011  . SROM (spontaneous rupture of membranes) 07/12/2011  . History of migraines   . SVD (spontaneous vaginal delivery) 07/13/2011    Past Surgical History  Procedure Laterality Date  . Wisdom tooth extraction      History reviewed. No pertinent family history.  History  Substance Use Topics  . Smoking status: Never Smoker   . Smokeless tobacco: Never Used  . Alcohol Use: No    OB History   Grav Para Term Preterm Abortions TAB SAB Ect Mult Living   2 1 1  0 0 0 0 0 0 1      Review of Systems  Constitutional:       Per HPI, otherwise negative  HENT:       Per HPI, otherwise negative  Respiratory:       Per HPI, otherwise negative  Cardiovascular:       Per HPI, otherwise negative  Gastrointestinal: Negative for vomiting.  Endocrine:       Negative aside from HPI  Genitourinary:       Neg aside from HPI   Musculoskeletal:       Per HPI, otherwise negative  Skin: Negative.   Neurological: Negative for syncope.  All other systems reviewed and are negative.    Allergies  Review of patient's allergies indicates no  known allergies.  Home Medications   Current Outpatient Rx  Name  Route  Sig  Dispense  Refill  . acetaminophen (TYLENOL) 500 MG tablet   Oral   Take 1,000 mg by mouth every 6 (six) hours as needed for pain.         Marland Kitchen ibuprofen (ADVIL,MOTRIN) 200 MG tablet   Oral   Take 600 mg by mouth every 6 (six) hours as needed for pain.           Triage Vitals: BP 150/72  Pulse 76  Temp(Src) 97.4 F (36.3 C) (Oral)  Resp 20  SpO2 100%  LMP 02/16/2013  Breastfeeding? No  Physical Exam  Nursing note and vitals reviewed. Constitutional: She is oriented to person, place, and time. She appears well-developed and well-nourished. No distress.  HENT:  Head: Normocephalic and atraumatic.  Eyes: Conjunctivae and EOM are normal.  Cardiovascular: Normal rate and regular rhythm.   Pulmonary/Chest: Effort normal and breath sounds normal. No stridor. No respiratory distress.  Abdominal: She exhibits no distension.  Musculoskeletal: She exhibits no edema.  Normal dorsiflexion and plantarflexion of the left great toe. Pain to dorsal metatarsal bone. Ecchymotic changes to the middle phalanx of great  toe. Appropriate strength and sensation throughout   Neurological: She is alert and oriented to person, place, and time. No cranial nerve deficit.  Skin: Skin is warm and dry.  Psychiatric: She has a normal mood and affect.    ED Course  ORTHOPEDIC INJURY TREATMENT Date/Time: 03/21/2013 10:24 PM Performed by: Gerhard Munch Authorized by: Gerhard Munch Consent: Verbal consent obtained. The procedure was performed in an emergent situation. Risks and benefits: risks, benefits and alternatives were discussed Consent given by: patient Injury location: toe Location details: left great toe Injury type: fracture Fracture type: middle phalanx Pre-procedure neurovascular assessment: neurovascularly intact Pre-procedure distal perfusion: normal Pre-procedure neurological function:  normal Pre-procedure range of motion: reduced Local anesthesia used: no Patient sedated: no Manipulation performed: no Immobilization: crutches (hard shoe) Post-procedure neurovascular assessment: post-procedure neurovascularly intact Post-procedure distal perfusion: normal Post-procedure neurological function: normal Post-procedure range of motion: unchanged Patient tolerance: Patient tolerated the procedure well with no immediate complications.   (including critical care time)  9:42 PM-Discussed treatment plan which includes x-ray of left toe and pain medication with pt at bedside and pt agreed to plan.    Labs Reviewed - No data to display Dg Toe Great Left  03/21/2013  *RADIOLOGY REPORT*  Clinical Data: Blunt trauma to left great toe  LEFT GREAT TOE  Comparison: None.  Findings:  There is a nondisplaced though slightly comminuted fracture involving the distal aspect of the proximal phalanx of the great toe with extension to involve the IP joint.  No definite dislocation.  Expected adjacent soft tissue swelling.  No radiopaque foreign body.  IMPRESSION: Nondisplaced though slightly comminuted fracture of the distal aspect of the proximal phalanx of the great toe with intra- articular extension.   Original Report Authenticated By: Tacey Ruiz, MD      No diagnosis found.    MDM   I personally performed the services described in this documentation, which was scribed in my presence. The recorded information has been reviewed and is accurate.   Patient presents after sustaining a fracture to her great toe.  She is neurovascularly intact, with no other injuries.  She was immobilized with a postop boot following taping.  She was discharged with crutches, orthopedics followup  Gerhard Munch, MD 03/21/13 2225

## 2013-03-21 NOTE — ED Notes (Signed)
Called patient for room placement with no response. Looked in lobby, bathroom and immediately outside of ER entrance. Unable to locate patient.

## 2013-03-21 NOTE — ED Notes (Signed)
Slipped on the floor, and lt. Foot hit the step. Lt. Toe is swollen, tender to palpation, difficult to move. Cap refill wnl. Throbbing.

## 2013-03-21 NOTE — ED Notes (Signed)
Called patient again for room placement, no response

## 2013-04-23 ENCOUNTER — Emergency Department (HOSPITAL_COMMUNITY)
Admission: EM | Admit: 2013-04-23 | Discharge: 2013-04-24 | Disposition: A | Discharge status: Home / Self Care | Payer: Self-pay | Attending: Emergency Medicine | Admitting: Emergency Medicine

## 2013-04-23 DIAGNOSIS — N898 Other specified noninflammatory disorders of vagina: Secondary | ICD-10-CM | POA: Insufficient documentation

## 2013-04-23 DIAGNOSIS — R110 Nausea: Secondary | ICD-10-CM | POA: Insufficient documentation

## 2013-04-23 DIAGNOSIS — R1310 Dysphagia, unspecified: Secondary | ICD-10-CM | POA: Insufficient documentation

## 2013-04-23 DIAGNOSIS — IMO0001 Reserved for inherently not codable concepts without codable children: Secondary | ICD-10-CM | POA: Insufficient documentation

## 2013-04-23 DIAGNOSIS — R109 Unspecified abdominal pain: Secondary | ICD-10-CM | POA: Insufficient documentation

## 2013-04-23 DIAGNOSIS — J020 Streptococcal pharyngitis: Secondary | ICD-10-CM | POA: Insufficient documentation

## 2013-04-23 NOTE — ED Notes (Signed)
Pt c/o sore throat and generalized body aches w/ headache x 1 week, not resolving w/ OTC tylenol and aleve

## 2013-04-24 ENCOUNTER — Encounter (HOSPITAL_COMMUNITY): Payer: Self-pay | Admitting: *Deleted

## 2013-04-24 LAB — URINALYSIS, ROUTINE W REFLEX MICROSCOPIC
Hgb urine dipstick: NEGATIVE
Nitrite: NEGATIVE
Specific Gravity, Urine: 1.016 (ref 1.005–1.030)
Urobilinogen, UA: 1 mg/dL (ref 0.0–1.0)
pH: 6 (ref 5.0–8.0)

## 2013-04-24 LAB — WET PREP, GENITAL: Clue Cells Wet Prep HPF POC: NONE SEEN

## 2013-04-24 MED ORDER — PENICILLIN G BENZATHINE 1200000 UNIT/2ML IM SUSP
1.2000 10*6.[IU] | Freq: Once | INTRAMUSCULAR | Status: AC
  Administered 2013-04-24: 1.2 10*6.[IU] via INTRAMUSCULAR
  Filled 2013-04-24: qty 2

## 2013-04-24 NOTE — ED Provider Notes (Signed)
Medical screening examination/treatment/procedure(s) were performed by non-physician practitioner and as supervising physician I was immediately available for consultation/collaboration.  Olivia Mackie, MD 04/24/13 [phone]

## 2013-04-24 NOTE — ED Notes (Signed)
Patient c/o sore throat x4 days, and generalized body aches, abd cramping, emesis, constant headache, increased urination, and vaginal discharge that started about 1 week ago. Patient was taking tylenol and aleve at home with no relief.

## 2013-04-24 NOTE — ED Provider Notes (Signed)
History     CSN: [id number]  Arrival date & time 04/23/13  2345   First MD Initiated Contact with Patient 04/24/13 0148      Chief Complaint  Patient presents with  . Sore Throat    (Consider location/radiation/quality/duration/timing/severity/associated sxs/prior treatment) HPI Comments: Patient states she's had a sore throat, generalized body aches, nausea, without vomiting, vaginal discharge, and vague abdominal pain for the past, week.  She been taking over-the-counter Tylenol, and Aleve with minimal relief  Patient is a 27 y.o. female presenting with pharyngitis. The history is provided by the patient.  Sore Throat This is a new problem. The current episode started in the past 7 days. The problem has been gradually worsening. Associated symptoms include abdominal pain, myalgias, nausea, a sore throat and swollen glands. Pertinent negatives include no chills, congestion, fever, rash, urinary symptoms, vomiting or weakness. The symptoms are aggravated by drinking and eating. She has tried nothing for the symptoms. The treatment provided no relief.    Past Medical History  Diagnosis Date  . Normal pregnancy 07/12/2011  . SROM (spontaneous rupture of membranes) 07/12/2011  . History of migraines   . SVD (spontaneous vaginal delivery) 07/13/2011    Past Surgical History  Procedure Laterality Date  . Wisdom tooth extraction      History reviewed. No pertinent family history.  History  Substance Use Topics  . Smoking status: Never Smoker   . Smokeless tobacco: Never Used  . Alcohol Use: No    OB History   Grav Para Term Preterm Abortions TAB SAB Ect Mult Living   2 1 1  0 0 0 0 0 0 1      Review of Systems  Constitutional: Negative for fever, chills, activity change and appetite change.  HENT: Positive for sore throat and trouble swallowing. Negative for congestion and rhinorrhea.   Gastrointestinal: Positive for nausea and abdominal pain. Negative for vomiting.   Genitourinary: Positive for vaginal discharge. Negative for dysuria, decreased urine volume, vaginal bleeding and vaginal pain.  Musculoskeletal: Positive for myalgias.  Skin: Negative for rash.  Neurological: Negative for weakness.  All other systems reviewed and are negative.    Allergies  Review of patient's allergies indicates no known allergies.  Home Medications   Current Outpatient Rx  Name  Route  Sig  Dispense  Refill  . acetaminophen (TYLENOL) 500 MG tablet   Oral   Take 500-1,000 mg by mouth every 6 (six) hours as needed for pain.          Marland Kitchen ibuprofen (ADVIL,MOTRIN) 200 MG tablet   Oral   Take 600 mg by mouth every 8 (eight) hours as needed for pain.          . naproxen sodium (ANAPROX) 220 MG tablet   Oral   Take 220 mg by mouth 2 (two) times daily as needed (pain).           BP 155/91  Pulse 88  Temp(Src) 98.2 F (36.8 C) (Oral)  Resp 19  Ht 5\' 2"  (1.575 m)  Wt 190 lb (86.183 kg)  BMI 34.74 kg/m2  SpO2 100%  LMP 04/11/2013  Breastfeeding? No  Physical Exam  Vitals reviewed. Constitutional: She is oriented to person, place, and time. She appears well-developed and well-nourished.  HENT:  Head: Normocephalic.  Eyes: Pupils are equal, round, and reactive to light.  Neck: Normal range of motion. No tracheal deviation present. No thyromegaly present.  Cardiovascular: Normal rate and regular rhythm.   Pulmonary/Chest: Effort  normal.  Abdominal: Soft. Bowel sounds are normal.  Genitourinary: Vagina normal and uterus normal. No vaginal discharge found.  Musculoskeletal: Normal range of motion. She exhibits no edema and no tenderness.  Lymphadenopathy:    She has cervical adenopathy.  Neurological: She is alert and oriented to person, place, and time.  Skin: Skin is warm and dry. No rash noted. No erythema.    ED Course  Procedures (including critical care time)  Labs Reviewed  RAPID STREP SCREEN - Abnormal; Notable for the following:     Streptococcus, Group A Screen (Direct) POSITIVE (*)    All other components within normal limits  WET PREP, GENITAL  GC/CHLAMYDIA PROBE AMP  URINALYSIS, ROUTINE W REFLEX MICROSCOPIC   No results found.   1. Strep throat       MDM   Patient's strep test is positive.  Her pelvic exam was benign without vaginal discharge.  Negative results on her wet prep urine shows no sign of infection.  Patient has been given an IM injection of LA, Bicillin, she'll not need any further antibiotics        Arman Filter, NP 04/24/13 0419  Arman Filter, NP 04/24/13 [phone]

## 2013-04-25 LAB — GC/CHLAMYDIA PROBE AMP
CT Probe RNA: NEGATIVE
GC Probe RNA: NEGATIVE

## 2013-05-10 ENCOUNTER — Inpatient Hospital Stay (HOSPITAL_COMMUNITY)
Admission: EM | Admit: 2013-05-10 | Discharge: 2013-05-12 | DRG: 419 | Disposition: A | Discharge status: Home / Self Care | Payer: Medicaid Other | Attending: General Surgery | Admitting: General Surgery

## 2013-05-10 ENCOUNTER — Emergency Department (HOSPITAL_COMMUNITY): Payer: Medicaid Other

## 2013-05-10 ENCOUNTER — Encounter (HOSPITAL_COMMUNITY): Payer: Self-pay | Admitting: *Deleted

## 2013-05-10 DIAGNOSIS — K8010 Calculus of gallbladder with chronic cholecystitis without obstruction: Principal | ICD-10-CM | POA: Diagnosis present

## 2013-05-10 DIAGNOSIS — Z87891 Personal history of nicotine dependence: Secondary | ICD-10-CM

## 2013-05-10 DIAGNOSIS — R1013 Epigastric pain: Secondary | ICD-10-CM

## 2013-05-10 DIAGNOSIS — K819 Cholecystitis, unspecified: Secondary | ICD-10-CM

## 2013-05-10 DIAGNOSIS — K8020 Calculus of gallbladder without cholecystitis without obstruction: Secondary | ICD-10-CM

## 2013-05-10 DIAGNOSIS — F1210 Cannabis abuse, uncomplicated: Secondary | ICD-10-CM | POA: Diagnosis present

## 2013-05-10 HISTORY — DX: Iron deficiency anemia, unspecified: D50.9

## 2013-05-10 HISTORY — DX: Anxiety disorder, unspecified: F41.9

## 2013-05-10 HISTORY — DX: Migraine, unspecified, not intractable, without status migrainosus: G43.909

## 2013-05-10 LAB — COMPREHENSIVE METABOLIC PANEL WITH GFR
ALT: 10 U/L (ref 0–35)
AST: 10 U/L (ref 0–37)
Albumin: 4 g/dL (ref 3.5–5.2)
Alkaline Phosphatase: 78 U/L (ref 39–117)
BUN: 7 mg/dL (ref 6–23)
CO2: 28 meq/L (ref 19–32)
Calcium: 9.5 mg/dL (ref 8.4–10.5)
Chloride: 103 meq/L (ref 96–112)
Creatinine, Ser: 0.65 mg/dL (ref 0.50–1.10)
GFR calc Af Amer: >90 mL/min
GFR calc non Af Amer: >90 mL/min
Glucose, Bld: 91 mg/dL (ref 70–99)
Potassium: 4 meq/L (ref 3.5–5.1)
Sodium: 140 meq/L (ref 135–145)
Total Bilirubin: 0.2 mg/dL — ABNORMAL LOW (ref 0.3–1.2)
Total Protein: 7.6 g/dL (ref 6.0–8.3)

## 2013-05-10 LAB — URINALYSIS, ROUTINE W REFLEX MICROSCOPIC
Protein, ur: NEGATIVE mg/dL
Specific Gravity, Urine: 1.017 (ref 1.005–1.030)
Urobilinogen, UA: 1 mg/dL (ref 0.0–1.0)

## 2013-05-10 LAB — POCT PREGNANCY, URINE: Preg Test, Ur: NEGATIVE

## 2013-05-10 LAB — CBC WITH DIFFERENTIAL/PLATELET
Hemoglobin: 13 g/dL (ref 12.0–15.0)
Lymphocytes Relative: 37 % (ref 12–46)
Lymphs Abs: 3.7 10*3/uL (ref 0.7–4.0)
Monocytes Relative: 3 % (ref 3–12)
Neutrophils Relative %: 58 % (ref 43–77)
Platelets: 365 10*3/uL (ref 150–400)
RBC: 4.72 MIL/uL (ref 3.87–5.11)
WBC: 10 10*3/uL (ref 4.0–10.5)

## 2013-05-10 LAB — URINE MICROSCOPIC-ADD ON

## 2013-05-10 LAB — SURGICAL PCR SCREEN: MRSA, PCR: NEGATIVE

## 2013-05-10 MED ORDER — HYDROMORPHONE HCL PF 1 MG/ML IJ SOLN
1.0000 mg | Freq: Once | INTRAMUSCULAR | Status: AC
  Administered 2013-05-10: 1 mg via INTRAVENOUS
  Filled 2013-05-10: qty 1

## 2013-05-10 MED ORDER — SODIUM CHLORIDE 0.9 % IV SOLN
3.0000 g | Freq: Four times a day (QID) | INTRAVENOUS | Status: DC
  Administered 2013-05-11 – 2013-05-12 (×7): 3 g via INTRAVENOUS
  Filled 2013-05-10 (×11): qty 3

## 2013-05-10 MED ORDER — ONDANSETRON HCL 4 MG/2ML IJ SOLN
4.0000 mg | Freq: Once | INTRAMUSCULAR | Status: AC
  Administered 2013-05-10: 4 mg via INTRAVENOUS
  Filled 2013-05-10: qty 2

## 2013-05-10 MED ORDER — MORPHINE SULFATE 4 MG/ML IJ SOLN
4.0000 mg | Freq: Once | INTRAMUSCULAR | Status: AC
  Administered 2013-05-10: 4 mg via INTRAVENOUS
  Filled 2013-05-10: qty 1

## 2013-05-10 MED ORDER — MORPHINE SULFATE 2 MG/ML IJ SOLN
1.0000 mg | INTRAMUSCULAR | Status: DC | PRN
  Administered 2013-05-10: 2 mg via INTRAVENOUS
  Administered 2013-05-11 (×2): 4 mg via INTRAVENOUS
  Administered 2013-05-11: 2 mg via INTRAVENOUS
  Administered 2013-05-11 (×2): 4 mg via INTRAVENOUS
  Administered 2013-05-11 – 2013-05-12 (×2): 2 mg via INTRAVENOUS
  Administered 2013-05-12: 4 mg via INTRAVENOUS
  Filled 2013-05-10: qty 1
  Filled 2013-05-10 (×2): qty 2
  Filled 2013-05-10 (×2): qty 1
  Filled 2013-05-10: qty 2
  Filled 2013-05-10: qty 1
  Filled 2013-05-10 (×2): qty 2

## 2013-05-10 MED ORDER — PANTOPRAZOLE SODIUM 40 MG IV SOLR
40.0000 mg | Freq: Once | INTRAVENOUS | Status: AC
  Administered 2013-05-10: 40 mg via INTRAVENOUS
  Filled 2013-05-10: qty 40

## 2013-05-10 MED ORDER — POTASSIUM CHLORIDE IN NACL 20-0.9 MEQ/L-% IV SOLN
INTRAVENOUS | Status: DC
  Administered 2013-05-10 – 2013-05-11 (×2): via INTRAVENOUS
  Administered 2013-05-12: 100 mL/h via INTRAVENOUS
  Filled 2013-05-10 (×7): qty 1000

## 2013-05-10 MED ORDER — ONDANSETRON HCL 4 MG/2ML IJ SOLN: 4.0000 mg | Freq: Three times a day (TID) | INTRAMUSCULAR | Status: DC | PRN

## 2013-05-10 NOTE — ED Provider Notes (Signed)
Medical screening examination/treatment/procedure(s) were conducted as a shared visit with non-physician practitioner(s) and myself.  I personally evaluated the patient during the encounter  Ultrasound concerning for cholecystitis.  General surgery to evaluate and admit to the hospital.  Lyanne Co, MD 05/10/13 [phone]

## 2013-05-10 NOTE — ED Notes (Signed)
Contacted Insurance underwriter about wait time.

## 2013-05-10 NOTE — H&P (Signed)
Lori Roy is an 27 y.o. female.   Referring MD:  Dorna Bloom, PA-C Chief Complaint: Epigastric abdominal pain, cholecystitis HPI:  27 y/o female with 2 week history of unexplained intermittent epigastric abdominal pain.  She was seen at [facility] on 04/24/13 where she complained of a sore throat, abdominal pain and vaginal discharge.  She was discharged home from the ED.  She notes that over the last week she has had daily epigastric abdominal pain in the evenings or in the middle of the night which is unrelated to foods.  It comes on with a sharp intense pain and lasts about 4-6 hours.  The pain radiates to the right side of her mid-upper back.  It is accompanied with nausea and diarrhea, but no vomiting.  She denies fever/chills.  She denies knowledge of previous cholelithiasis.     Past Medical History  Diagnosis Date  . Normal pregnancy 07/12/2011  . SROM (spontaneous rupture of membranes) 07/12/2011  . History of migraines   . SVD (spontaneous vaginal delivery) 07/13/2011  . Iron deficiency anemia     Past Surgical History  Procedure Laterality Date  . Wisdom tooth extraction      History reviewed. No pertinent family history. Social History:  reports that she has quit smoking. Her smoking use included Cigarettes. She has a .3 pack-year smoking history. She has never used smokeless tobacco. She reports that she uses illicit drugs (Marijuana). She reports that she does not drink alcohol.  Allergies: No Known Allergies   (Not in a hospital admission)  Results for orders placed during the hospital encounter of 05/10/13 (from the past 48 hour(s))  CBC WITH DIFFERENTIAL     Status: None   Collection Time    05/10/13 12:13 PM      Result Value Range   WBC 10.0  4.0 - 10.5 K/uL   RBC 4.72  3.87 - 5.11 MIL/uL   Hemoglobin 13.0  12.0 - 15.0 g/dL   HCT 09.6  04.5 - 40.9 %   MCV 81.6  78.0 - 100.0 fL   MCH 27.5  26.0 - 34.0 pg   MCHC 33.8  30.0 - 36.0 g/dL   RDW 81.1   91.4 - 78.2 %   Platelets 365  150 - 400 K/uL   Neutrophils Relative % 58  43 - 77 %   Neutro Abs 5.8  1.7 - 7.7 K/uL   Lymphocytes Relative 37  12 - 46 %   Lymphs Abs 3.7  0.7 - 4.0 K/uL   Monocytes Relative 3  3 - 12 %   Monocytes Absolute 0.3  0.1 - 1.0 K/uL   Eosinophils Relative 2  0 - 5 %   Eosinophils Absolute 0.2  0.0 - 0.7 K/uL   Basophils Relative 0  0 - 1 %   Basophils Absolute 0.0  0.0 - 0.1 K/uL  COMPREHENSIVE METABOLIC PANEL     Status: Abnormal   Collection Time    05/10/13 12:13 PM      Result Value Range   Sodium 140  135 - 145 mEq/L   Potassium 4.0  3.5 - 5.1 mEq/L   Chloride 103  96 - 112 mEq/L   CO2 28  19 - 32 mEq/L   Glucose, Bld 91  70 - 99 mg/dL   BUN 7  6 - 23 mg/dL   Creatinine, Ser 9.56  0.50 - 1.10 mg/dL   Calcium 9.5  8.4 - 21.3 mg/dL   Total  Protein 7.6  6.0 - 8.3 g/dL   Albumin 4.0  3.5 - 5.2 g/dL   AST 10  0 - 37 U/L   ALT 10  0 - 35 U/L   Alkaline Phosphatase 78  39 - 117 U/L   Total Bilirubin 0.2 (*) 0.3 - 1.2 mg/dL   GFR calc non Af Amer >90  >90 mL/min   GFR calc Af Amer >90  >90 mL/min   Comment:            The eGFR has been calculated     using the CKD EPI equation.     This calculation has not been     validated in all clinical     situations.     eGFR's persistently     <90 mL/min signify     possible Chronic Kidney Disease.  URINALYSIS, ROUTINE W REFLEX MICROSCOPIC     Status: Abnormal   Collection Time    05/10/13 12:24 PM      Result Value Range   Color, Urine YELLOW  YELLOW   APPearance HAZY (*) CLEAR   Specific Gravity, Urine 1.017  1.005 - 1.030   pH 7.0  5.0 - 8.0   Glucose, UA NEGATIVE  NEGATIVE mg/dL   Hgb urine dipstick LARGE (*) NEGATIVE   Bilirubin Urine NEGATIVE  NEGATIVE   Ketones, ur NEGATIVE  NEGATIVE mg/dL   Protein, ur NEGATIVE  NEGATIVE mg/dL   Urobilinogen, UA 1.0  0.0 - 1.0 mg/dL   Nitrite NEGATIVE  NEGATIVE   Leukocytes, UA MODERATE (*) NEGATIVE  URINE MICROSCOPIC-ADD ON     Status: Abnormal    Collection Time    05/10/13 12:24 PM      Result Value Range   Squamous Epithelial / LPF MANY (*) RARE   WBC, UA 11-20  <3 WBC/hpf   RBC / HPF 0-2  <3 RBC/hpf   Bacteria, UA MANY (*) RARE  POCT PREGNANCY, URINE     Status: None   Collection Time    05/10/13 12:28 PM      Result Value Range   Preg Test, Ur NEGATIVE  NEGATIVE   Comment:            THE SENSITIVITY OF THIS     METHODOLOGY IS >24 mIU/mL   Dg Chest 2 View  05/10/2013   *RADIOLOGY REPORT*  Clinical Data: Chest pain  CHEST - 2 VIEW  Comparison: Chest radiograph 05/17/2012  Findings: Normal mediastinum and heart silhouette.  Costophrenic angles are clear.  No effusion, infiltrate, or pneumothorax.  IMPRESSION: No acute cardiopulmonary process.   Original Report Authenticated By: Genevive Bi, M.D.   US Abdomen Complete  05/10/2013   *RADIOLOGY REPORT*  Clinical Data:  Generalized abdominal pain.  COMPLETE ABDOMINAL ULTRASOUND  Comparison:  CT abdomen pelvis 05/17/2012.  Findings:  Gallbladder:  Shadowing gallstone in the neck of the gallbladder. Marked wall thickening and edema, measuring up to approximately 10 mm in thickness.  Minimal pericholecystic fluid.  Negative sonographic Murphy's sign according to the ultrasound technologist.  Common bile duct:  Normal in caliber with maximum diameter approximating 7 mm.  No visible common duct stone.  Liver:  Diffusely increased and coarsened echotexture without focal parenchymal abnormality.  Patent portal vein with hepatopetal flow.  IVC:  Patent.  Pancreas:  Although the pancreas is difficult to visualize in its entirety, no focal pancreatic abnormality is identified.  Spleen:  Normal size and echotexture without focal parenchymal abnormality.  Right Kidney:  No  hydronephrosis.  Well-preserved cortex.  No shadowing calculi.  Normal size and parenchymal echotexture without focal abnormalities.  Approximately 12.6 cm in length.  Left Kidney:  No hydronephrosis.  Well-preserved cortex.  No  shadowing calculi.  Normal size and parenchymal echotexture without focal abnormalities.  Approximately 11.7 cm in length.  Abdominal aorta:  Normal in caliber throughout its visualized course in the abdomen without significant atherosclerosis.  IMPRESSION:  1.  Cholelithiasis, with an impacted gallstone in the gallbladder neck.  Marked gallbladder wall thickening and minimal pericholecystic fluid is consistent with cholecystitis. 2.  No biliary ductal dilation. 3.  Diffuse hepatic steatosis without focal hepatic parenchymal abnormality. 4.  Otherwise normal examination.   Original Report Authenticated By: Hulan Saas, M.D.    Review of Systems  Constitutional: Positive for malaise/fatigue. Negative for fever, chills, weight loss and diaphoresis.  Respiratory: Negative for cough, shortness of breath and wheezing.   Cardiovascular: Negative for chest pain.  Gastrointestinal: Positive for nausea, abdominal pain (epigastric and RUQ) and diarrhea. Negative for heartburn, vomiting, constipation and blood in stool.  Genitourinary: Negative for dysuria, urgency and frequency.  Musculoskeletal: Negative for myalgias and joint pain.  Neurological: Negative for weakness.    Blood pressure 164/83, pulse 64, temperature 98.2 F (36.8 C), temperature source Oral, resp. rate 16, last menstrual period 05/10/2013, SpO2 100.00%. Physical Exam  Constitutional: She is oriented to person, place, and time. She appears well-developed and well-nourished. She appears distressed (moderate due to pain).  HENT:  Head: Normocephalic and atraumatic.  Right Ear: External ear normal.  Left Ear: External ear normal.  Nose: Nose normal.  Mouth/Throat: Oropharynx is clear and moist. No oropharyngeal exudate.  Eyes: Conjunctivae and EOM are normal. Pupils are equal, round, and reactive to light. Right eye exhibits no discharge. Left eye exhibits no discharge.  Cardiovascular: Normal rate, regular rhythm and normal heart  sounds.  Exam reveals no gallop and no friction rub.   No murmur heard. Respiratory: Effort normal and breath sounds normal. No respiratory distress. She has no wheezes. She has no rales.  GI: Soft. Bowel sounds are normal. She exhibits no distension, no fluid wave, no ascites and no mass. There is no hepatosplenomegaly. There is tenderness (epigastric, RUQ, LUQ) in the epigastric area. There is guarding (subjective guarding only). There is no rigidity, no rebound, no tenderness at McBurney's point and negative Murphy's sign. No hernia. Hernia confirmed negative in the ventral area, confirmed negative in the right inguinal area and confirmed negative in the left inguinal area.  No abdominal scars noted   Musculoskeletal: Normal range of motion.  Neurological: She is alert and oriented to person, place, and time.  Skin: Skin is warm and dry. No rash noted. No erythema. No pallor.  Psychiatric: She has a normal mood and affect. Her behavior is normal. Judgment and thought content normal.     Assessment/Plan Epigastric abdominal pain Cholelithiasis Biliary colic - likely cholecystitis  Plan: 1.  Admit to [facility] 2.  Plan for OR tomorrow for lap chole with IOC 3.  NPO, IVF, pain control, antiemetics, and antibiotics 4.  SCD's, hold VTE prophylaxis until after surgery  DORT, Edson Deridder 05/10/2013, 3:57 PM

## 2013-05-10 NOTE — ED Notes (Signed)
Attempted to call report x 1  

## 2013-05-10 NOTE — ED Provider Notes (Signed)
Date: 05/10/2013  Rate: 61  Rhythm: normal sinus rhythm  QRS Axis: normal  Intervals: normal  ST/T Wave abnormalities: normal  Conduction Disutrbances: none  Narrative Interpretation:   Old EKG Reviewed: No significant changes noted     Lyanne Co, MD 05/10/13 1729

## 2013-05-10 NOTE — ED Notes (Signed)
To ED for eval of upper abd pain with radiation to back. Pt states she was seen at [facility] earlier in the month and given PCN. Pain is intermittent but last 4-6 hrs at a time. Nausea. No difficulty with urination. Diarrhea for past 2 wks. States she is on period now but prior to starting noticed abn vaginal discharge.

## 2013-05-10 NOTE — ED Provider Notes (Signed)
History    CSN: [id number] Arrival date & time 05/10/13  1138  First MD Initiated Contact with Patient 05/10/13 1219     Chief Complaint  Patient presents with  . Abdominal Pain   (Consider location/radiation/quality/duration/timing/severity/associated sxs/prior Treatment) HPI Comments: Patient presents today with a chief complaint of epigastric pain and RUQ abdominal pain.  She reports that the pain has been intermittent for the past week.  She has not noticed any association with eating.  She states that the pain typically lasts a few hours and then resolves without intervention.  She describes the pain as a sharp pain and reports that it radiates through to her back.  She has taken Rolaids for her pain without relief.  She also reports that the pain radiates up into her chest.  She denies fever or chills.  She has had some associated nausea, but no vomiting.  Denies shortness of breath.  She denies prior history of Gallbladder disease.    The history is provided by the patient.   Past Medical History  Diagnosis Date  . Normal pregnancy 07/12/2011  . SROM (spontaneous rupture of membranes) 07/12/2011  . History of migraines   . SVD (spontaneous vaginal delivery) 07/13/2011   Past Surgical History  Procedure Laterality Date  . Wisdom tooth extraction     No family history on file. History  Substance Use Topics  . Smoking status: Never Smoker   . Smokeless tobacco: Never Used  . Alcohol Use: No   OB History   Grav Para Term Preterm Abortions TAB SAB Ect Mult Living   2 1 1  0 0 0 0 0 0 1     Review of Systems  Gastrointestinal: Positive for nausea and abdominal pain.  All other systems reviewed and are negative.    Allergies  Review of patient's allergies indicates no known allergies.  Home Medications   Current Outpatient Rx  Name  Route  Sig  Dispense  Refill  . acetaminophen (TYLENOL) 500 MG tablet   Oral   Take 500 mg by mouth every 6 (six) hours as needed for  pain.          . Ca Carbonate-Mag Hydroxide (ROLAIDS PO)   Oral   Take 4 tablets by mouth daily as needed (for stomach indigestion).          Marland Kitchen ibuprofen (ADVIL,MOTRIN) 200 MG tablet   Oral   Take 600 mg by mouth every 8 (eight) hours as needed for pain.          . naproxen sodium (ANAPROX) 220 MG tablet   Oral   Take 220 mg by mouth 2 (two) times daily as needed (pain).          BP 144/91  Pulse 74  Temp(Src) 98.2 F (36.8 C) (Oral)  Resp 16  SpO2 99%  LMP 05/10/2013 Physical Exam  Nursing note and vitals reviewed. Constitutional: She appears well-developed and well-nourished.  HENT:  Head: Normocephalic and atraumatic.  Mouth/Throat: Oropharynx is clear and moist.  Neck: Normal range of motion. Neck supple.  Cardiovascular: Normal rate, regular rhythm and normal heart sounds.   Pulmonary/Chest: Effort normal and breath sounds normal.  Abdominal: Soft. Bowel sounds are normal. She exhibits no distension and no mass. There is tenderness in the right upper quadrant and epigastric area. There is no rebound, no guarding and negative Murphy's sign.  Neurological: She is alert.  Skin: Skin is warm and dry.  Psychiatric: She has a normal mood  and affect.    ED Course  Procedures (including critical care time) Labs Reviewed  COMPREHENSIVE METABOLIC PANEL - Abnormal; Notable for the following:    Total Bilirubin 0.2 (*)    All other components within normal limits  CBC WITH DIFFERENTIAL  URINALYSIS, ROUTINE W REFLEX MICROSCOPIC  POCT PREGNANCY, URINE   Dg Chest 2 View  05/10/2013   *RADIOLOGY REPORT*  Clinical Data: Chest pain  CHEST - 2 VIEW  Comparison: Chest radiograph 05/17/2012  Findings: Normal mediastinum and heart silhouette.  Costophrenic angles are clear.  No effusion, infiltrate, or pneumothorax.  IMPRESSION: No acute cardiopulmonary process.   Original Report Authenticated By: Genevive Bi, M.D.   US Abdomen Complete  05/10/2013   *RADIOLOGY REPORT*   Clinical Data:  Generalized abdominal pain.  COMPLETE ABDOMINAL ULTRASOUND  Comparison:  CT abdomen pelvis 05/17/2012.  Findings:  Gallbladder:  Shadowing gallstone in the neck of the gallbladder. Marked wall thickening and edema, measuring up to approximately 10 mm in thickness.  Minimal pericholecystic fluid.  Negative sonographic Murphy's sign according to the ultrasound technologist.  Common bile duct:  Normal in caliber with maximum diameter approximating 7 mm.  No visible common duct stone.  Liver:  Diffusely increased and coarsened echotexture without focal parenchymal abnormality.  Patent portal vein with hepatopetal flow.  IVC:  Patent.  Pancreas:  Although the pancreas is difficult to visualize in its entirety, no focal pancreatic abnormality is identified.  Spleen:  Normal size and echotexture without focal parenchymal abnormality.  Right Kidney:  No hydronephrosis.  Well-preserved cortex.  No shadowing calculi.  Normal size and parenchymal echotexture without focal abnormalities.  Approximately 12.6 cm in length.  Left Kidney:  No hydronephrosis.  Well-preserved cortex.  No shadowing calculi.  Normal size and parenchymal echotexture without focal abnormalities.  Approximately 11.7 cm in length.  Abdominal aorta:  Normal in caliber throughout its visualized course in the abdomen without significant atherosclerosis.  IMPRESSION:  1.  Cholelithiasis, with an impacted gallstone in the gallbladder neck.  Marked gallbladder wall thickening and minimal pericholecystic fluid is consistent with cholecystitis. 2.  No biliary ductal dilation. 3.  Diffuse hepatic steatosis without focal hepatic parenchymal abnormality. 4.  Otherwise normal examination.   Original Report Authenticated By: Hulan Saas, M.D.   No diagnosis found.  3:26 PM Discussed with Megan the PA with [facility].  She reports that surgery service will be down to see the patient in the ED.   MDM  Patient presents with epigastric abdominal pain  and RUQ pain that has been intermittent over the past week.  Labs unremarkable.  Ultrasound showing Cholecystitis.  [facility] consulted.  Pascal Lux Valier, PA-C 05/10/13 1644

## 2013-05-10 NOTE — ED Notes (Addendum)
General Surgery PA at bedside 

## 2013-05-11 ENCOUNTER — Inpatient Hospital Stay (HOSPITAL_COMMUNITY): Payer: Medicaid Other

## 2013-05-11 ENCOUNTER — Inpatient Hospital Stay (HOSPITAL_COMMUNITY): Payer: Medicaid Other | Admitting: Certified Registered"

## 2013-05-11 ENCOUNTER — Encounter (HOSPITAL_COMMUNITY): Payer: Self-pay | Admitting: Certified Registered"

## 2013-05-11 ENCOUNTER — Encounter (HOSPITAL_COMMUNITY): Payer: Self-pay | Admitting: Anesthesiology

## 2013-05-11 ENCOUNTER — Encounter (HOSPITAL_COMMUNITY): Admission: EM | Disposition: A | Discharge status: Home / Self Care | Payer: Self-pay | Source: Home / Self Care

## 2013-05-11 HISTORY — PX: CHOLECYSTECTOMY: SHX55

## 2013-05-11 LAB — URINE CULTURE
Colony Count: NO GROWTH
Culture: NO GROWTH

## 2013-05-11 LAB — CBC
Hemoglobin: 10.9 g/dL — ABNORMAL LOW (ref 12.0–15.0)
MCH: 26.1 pg (ref 26.0–34.0)
Platelets: 294 10*3/uL (ref 150–400)
RBC: 4.18 MIL/uL (ref 3.87–5.11)
WBC: 6.9 10*3/uL (ref 4.0–10.5)

## 2013-05-11 LAB — BASIC METABOLIC PANEL
Calcium: 8.1 mg/dL — ABNORMAL LOW (ref 8.4–10.5)
GFR calc Af Amer: >90 mL/min (ref >90)
GFR calc non Af Amer: >90 mL/min (ref >90)
Potassium: 3.8 mEq/L (ref 3.5–5.1)
Sodium: 137 mEq/L (ref 135–145)

## 2013-05-11 SURGERY — LAPAROSCOPIC CHOLECYSTECTOMY WITH INTRAOPERATIVE CHOLANGIOGRAM
Anesthesia: General | Site: Abdomen | Wound class: Clean Contaminated

## 2013-05-11 MED ORDER — NEOSTIGMINE METHYLSULFATE 1 MG/ML IJ SOLN
INTRAMUSCULAR | Status: DC | PRN
  Administered 2013-05-11: 2.5 mg via INTRAVENOUS

## 2013-05-11 MED ORDER — MIDAZOLAM HCL 5 MG/5ML IJ SOLN
INTRAMUSCULAR | Status: DC | PRN
  Administered 2013-05-11: 2 mg via INTRAVENOUS

## 2013-05-11 MED ORDER — BUPIVACAINE-EPINEPHRINE 0.25% -1:200000 IJ SOLN
INTRAMUSCULAR | Status: DC | PRN
  Administered 2013-05-11: 30 mL

## 2013-05-11 MED ORDER — GLYCOPYRROLATE 0.2 MG/ML IJ SOLN
INTRAMUSCULAR | Status: DC | PRN
  Administered 2013-05-11: 0.4 mg via INTRAVENOUS

## 2013-05-11 MED ORDER — SODIUM CHLORIDE 0.9 % IR SOLN
Status: DC | PRN
  Administered 2013-05-11: 1000 mL

## 2013-05-11 MED ORDER — PROPOFOL 10 MG/ML IV BOLUS
INTRAVENOUS | Status: DC | PRN
  Administered 2013-05-11: 50 mg via INTRAVENOUS
  Administered 2013-05-11: 200 mg via INTRAVENOUS
  Administered 2013-05-11: 50 mg via INTRAVENOUS

## 2013-05-11 MED ORDER — ONDANSETRON HCL 4 MG/2ML IJ SOLN
INTRAMUSCULAR | Status: DC | PRN
  Administered 2013-05-11: 4 mg via INTRAVENOUS

## 2013-05-11 MED ORDER — FENTANYL CITRATE 0.05 MG/ML IJ SOLN
INTRAMUSCULAR | Status: DC | PRN
  Administered 2013-05-11 (×2): 100 ug via INTRAVENOUS
  Administered 2013-05-11: 150 ug via INTRAVENOUS
  Administered 2013-05-11: 50 ug via INTRAVENOUS

## 2013-05-11 MED ORDER — OXYCODONE HCL 5 MG/5ML PO SOLN: 5.0000 mg | Freq: Once | ORAL | Status: DC | PRN

## 2013-05-11 MED ORDER — ENOXAPARIN SODIUM 40 MG/0.4ML ~~LOC~~ SOLN
40.0000 mg | SUBCUTANEOUS | Status: DC
  Administered 2013-05-12: 40 mg via SUBCUTANEOUS
  Filled 2013-05-11: qty 0.4

## 2013-05-11 MED ORDER — FENTANYL CITRATE 0.05 MG/ML IJ SOLN: 50.0000 ug | Freq: Once | INTRAMUSCULAR | Status: DC

## 2013-05-11 MED ORDER — LACTATED RINGERS IV SOLN
INTRAVENOUS | Status: DC | PRN
  Administered 2013-05-11 (×2): via INTRAVENOUS

## 2013-05-11 MED ORDER — HYDROMORPHONE HCL PF 1 MG/ML IJ SOLN
INTRAMUSCULAR | Status: AC
  Administered 2013-05-11: 0.5 mg via INTRAVENOUS
  Filled 2013-05-11: qty 1

## 2013-05-11 MED ORDER — HYDROMORPHONE HCL PF 1 MG/ML IJ SOLN: 0.2500 mg | INTRAMUSCULAR | Status: DC | PRN

## 2013-05-11 MED ORDER — MIDAZOLAM HCL 2 MG/2ML IJ SOLN: 1.0000 mg | INTRAMUSCULAR | Status: DC | PRN

## 2013-05-11 MED ORDER — PROMETHAZINE HCL 25 MG/ML IJ SOLN
6.2500 mg | INTRAMUSCULAR | Status: DC | PRN
  Filled 2013-05-11: qty 1

## 2013-05-11 MED ORDER — HYDROMORPHONE HCL PF 1 MG/ML IJ SOLN
0.2500 mg | INTRAMUSCULAR | Status: DC | PRN
  Administered 2013-05-11 (×2): 0.5 mg via INTRAVENOUS

## 2013-05-11 MED ORDER — OXYCODONE HCL 5 MG PO TABS: 5.0000 mg | ORAL_TABLET | Freq: Once | ORAL | Status: DC | PRN

## 2013-05-11 MED ORDER — ROCURONIUM BROMIDE 100 MG/10ML IV SOLN
INTRAVENOUS | Status: DC | PRN
  Administered 2013-05-11: 50 mg via INTRAVENOUS

## 2013-05-11 MED ORDER — ONDANSETRON HCL 4 MG/2ML IJ SOLN
4.0000 mg | Freq: Four times a day (QID) | INTRAMUSCULAR | Status: DC | PRN
  Administered 2013-05-11: 4 mg via INTRAVENOUS
  Filled 2013-05-11: qty 2

## 2013-05-11 MED ORDER — ONDANSETRON HCL 4 MG PO TABS: 4.0000 mg | ORAL_TABLET | Freq: Four times a day (QID) | ORAL | Status: DC | PRN

## 2013-05-11 MED ORDER — ARTIFICIAL TEARS OP OINT
TOPICAL_OINTMENT | OPHTHALMIC | Status: DC | PRN
  Administered 2013-05-11: 1 via OPHTHALMIC

## 2013-05-11 MED ORDER — LIDOCAINE HCL (CARDIAC) 20 MG/ML IV SOLN
INTRAVENOUS | Status: DC | PRN
  Administered 2013-05-11: 60 mg via INTRAVENOUS

## 2013-05-11 MED ORDER — SODIUM CHLORIDE 0.9 % IV SOLN
INTRAVENOUS | Status: DC | PRN
  Administered 2013-05-11: 15:00:00

## 2013-05-11 SURGICAL SUPPLY — 43 items
APPLIER CLIP 5 13 M/L LIGAMAX5 (MISCELLANEOUS) ×2
APPLIER CLIP ROT 10 11.4 M/L (STAPLE)
BLADE SURG ROTATE 9660 (MISCELLANEOUS) IMPLANT
CANISTER SUCTION 2500CC (MISCELLANEOUS) ×2 IMPLANT
CHLORAPREP W/TINT 26ML (MISCELLANEOUS) ×2 IMPLANT
CLIP APPLIE 5 13 M/L LIGAMAX5 (MISCELLANEOUS) ×1 IMPLANT
CLIP APPLIE ROT 10 11.4 M/L (STAPLE) IMPLANT
CLOTH BEACON ORANGE TIMEOUT ST (SAFETY) ×2 IMPLANT
COVER MAYO STAND STRL (DRAPES) ×2 IMPLANT
COVER SURGICAL LIGHT HANDLE (MISCELLANEOUS) ×2 IMPLANT
DECANTER SPIKE VIAL GLASS SM (MISCELLANEOUS) ×4 IMPLANT
DERMABOND ADHESIVE PROPEN (GAUZE/BANDAGES/DRESSINGS) ×1
DERMABOND ADVANCED (GAUZE/BANDAGES/DRESSINGS) ×1
DERMABOND ADVANCED .7 DNX12 (GAUZE/BANDAGES/DRESSINGS) ×1 IMPLANT
DERMABOND ADVANCED .7 DNX6 (GAUZE/BANDAGES/DRESSINGS) ×1 IMPLANT
DRAPE C-ARM 42X72 X-RAY (DRAPES) ×2 IMPLANT
DRAPE UTILITY 15X26 W/TAPE STR (DRAPE) ×4 IMPLANT
DRSG TEGADERM 4X4.75 (GAUZE/BANDAGES/DRESSINGS) ×2 IMPLANT
ELECT REM PT RETURN 9FT ADLT (ELECTROSURGICAL) ×2
ELECTRODE REM PT RTRN 9FT ADLT (ELECTROSURGICAL) ×1 IMPLANT
GLOVE BIOGEL PI IND STRL 8 (GLOVE) ×1 IMPLANT
GLOVE BIOGEL PI INDICATOR 8 (GLOVE) ×1
GLOVE ECLIPSE 7.5 STRL STRAW (GLOVE) ×2 IMPLANT
GOWN STRL NON-REIN LRG LVL3 (GOWN DISPOSABLE) ×4 IMPLANT
KIT BASIN OR (CUSTOM PROCEDURE TRAY) ×2 IMPLANT
KIT ROOM TURNOVER OR (KITS) ×2 IMPLANT
NS IRRIG 1000ML POUR BTL (IV SOLUTION) ×2 IMPLANT
PAD ARMBOARD 7.5X6 YLW CONV (MISCELLANEOUS) ×4 IMPLANT
POUCH SPECIMEN RETRIEVAL 10MM (ENDOMECHANICALS) IMPLANT
SCISSORS LAP 5X35 DISP (ENDOMECHANICALS) IMPLANT
SET CHOLANGIOGRAPH 5 50 .035 (SET/KITS/TRAYS/PACK) ×2 IMPLANT
SET IRRIG TUBING LAPAROSCOPIC (IRRIGATION / IRRIGATOR) ×2 IMPLANT
SLEEVE ENDOPATH XCEL 5M (ENDOMECHANICALS) ×4 IMPLANT
SPECIMEN JAR SMALL (MISCELLANEOUS) ×2 IMPLANT
STRIP CLOSURE SKIN 1/2X4 (GAUZE/BANDAGES/DRESSINGS) ×2 IMPLANT
SUT MNCRL AB 4-0 PS2 18 (SUTURE) ×2 IMPLANT
TOWEL OR 17X24 6PK STRL BLUE (TOWEL DISPOSABLE) ×2 IMPLANT
TOWEL OR 17X26 10 PK STRL BLUE (TOWEL DISPOSABLE) ×2 IMPLANT
TRAY LAPAROSCOPIC (CUSTOM PROCEDURE TRAY) ×2 IMPLANT
TROCAR XCEL BLUNT TIP 100MML (ENDOMECHANICALS) ×2 IMPLANT
TROCAR XCEL NON-BLD 11X100MML (ENDOMECHANICALS) IMPLANT
TROCAR XCEL NON-BLD 5MMX100MML (ENDOMECHANICALS) ×2 IMPLANT
WATER STERILE IRR 1000ML POUR (IV SOLUTION) IMPLANT

## 2013-05-11 NOTE — Preoperative (Signed)
Beta Blockers   Reason not to administer Beta Blockers:Not Applicable 

## 2013-05-11 NOTE — Op Note (Signed)
OPERATIVE REPORT  DATE OF OPERATION: 05/10/2013 - 05/11/2013  PATIENT:  Lori Roy D [place]endelton  27 y.o. female  PRE-OPERATIVE DIAGNOSIS:  Cholelithiasis, likely cholecystitis  POST-OPERATIVE DIAGNOSIS:  cholelithiasis  PROCEDURE:  Procedure(s): LAPAROSCOPIC CHOLECYSTECTOMY WITH INTRAOPERATIVE CHOLANGIOGRAM  SURGEON:  Surgeon(s): Cherylynn Ridges, MD  ASSISTANT: Dort, PA-C  ANESTHESIA:   general  EBL: <20 ml  BLOOD ADMINISTERED: none  DRAINS: none   SPECIMEN:  Source of Specimen:  Gallbladder ans stones  COUNTS CORRECT:  YES  PROCEDURE DETAILS: The patient was taken to the operating room and placed on the table in the supine position.  After an adequate endotracheal anesthetic was administered, she was prepped with ChloroPrep, and then draped in the usual manner exposing the entire abdomen laterally, inferiorly and up  to the costal margins.  After a proper timeout was performed including identifying the patient and the procedure to be performed, a supra-umbilical 1.5cm midline incision was made using a #15 blade.  This was taken down to the fascia which was then incised with a #15 blade.  The edges of the fascia were tented up with Kocher clamps as the preperitoneal space was penetrated with a Kelly clamp into the peritoneum.  Once this was done, a pursestring suture of 0 Vicryl was passed around the fascial opening.  This was subsequently used to secure the [facility] cannula which was passed into the peritoneal cavity.  Once the [facility] cannula was in place, carbon dioxide gas was insufflated into the peritoneal cavity up to a maximal intra-abdominal pressure of 15mm Hg.The laparoscope, with attached camera and light source, was passed into the peritoneal cavity to visualize the direct insertion of two right upper quadrant 5mm cannulas, and a sup-xiphoid 5mm cannula.  Once all cannulas were in place, the dissection was begun.  Two ratcheted graspers were attached to the dome and infundibulum  of the gallbladder and retracted towards the anterior abdominal wall and the right upper quadrant.  Using cautery attached to a dissecting forceps, the peritoneum overlaying the triangle of Chalot and the hepatoduodenal triangle was dissected away exposing the cystic duct and the cystic artery.  A clip was placed on the gallbladder side of the cystic duct, then a cholecytodochotomy made using the laparoscopic scissors.  Through the cholecystodochotomy a Cook catheter was passed to performed a cholangiogram.  The cholangiogram showed good flow into the duodenum, no ductal dilatation, no intraductal filling defects..  Once the cholangiogram was completed, the [facility] catheter was removed, and the distal cystic duct was clipped multiple times then transected.  The gallbladder was then dissected out of the hepatic bed without event.  It was retrieved from the abdomen using an EndoCatch bag without event.  Once the gallbladder was removed, the bed was inspected for hemostasis.  Once excellent hemostasis was obtained all gas and fluids were aspirated from above the liver, then the cannulas were removed.  The supra-umbilical incision was closed using the pursestring suture which was in place.  0.25% bupivicaine with epinephrine was injected at all sites.  All 10mm or greater cannula sites were close using a running subcuticular stitch of 4-0 Monocryl.  5.94mm cannula sites were closed with Dermabond only.Steri-Strips and Tagaderm were used to complete the dressings at all sites.  At this point all needle, sponge, and instrument counts were correct.The patient was awakened from anesthesia and taken to the PACU in stable condition.  PATIENT DISPOSITION:  PACU - hemodynamically stable.   Cherylynn Ridges 6/25/20145:01 PM

## 2013-05-11 NOTE — Transfer of Care (Signed)
Immediate Anesthesia Transfer of Care Note  Patient: Lori Roy  Procedure(s) Performed: Procedure(s): LAPAROSCOPIC CHOLECYSTECTOMY WITH INTRAOPERATIVE CHOLANGIOGRAM (N/A)  Patient Location: PACU  Anesthesia Type:General  Level of Consciousness: awake  Airway & Oxygen Therapy: Patient Spontanous Breathing and Patient connected to nasal cannula oxygen  Post-op Assessment: Report given to PACU RN, Post -op Vital signs reviewed and stable and Patient moving all extremities  Post vital signs: Reviewed and stable  Complications: No apparent anesthesia complications

## 2013-05-11 NOTE — Anesthesia Postprocedure Evaluation (Signed)
Anesthesia Post Note  Patient: Lori Roy  Procedure(s) Performed: Procedure(s) (LRB): LAPAROSCOPIC CHOLECYSTECTOMY WITH INTRAOPERATIVE CHOLANGIOGRAM (N/A)  Anesthesia type: General  Patient location: PACU  Post pain: Pain level controlled and Adequate analgesia  Post assessment: Post-op Vital signs reviewed, Patient's Cardiovascular Status Stable, Respiratory Function Stable, Patent Airway and Pain level controlled  Last Vitals:  Filed Vitals:   05/11/13 1715  BP: 159/78  Pulse: 70  Temp:   Resp: 19    Post vital signs: Reviewed and stable  Level of consciousness: awake, alert  and oriented  Complications: No apparent anesthesia complications

## 2013-05-11 NOTE — H&P (Signed)
Biliary colic for surgery tomorrow.  Lori Roy. [name], MD, FACS [phone] [facility]

## 2013-05-11 NOTE — Interval H&P Note (Signed)
History and Physical Interval Note:  05/11/2013 8:46 AM  Lori Roy  has presented today for surgery, with the diagnosis of Cholelithiasis, likely cholecystitis  The various methods of treatment have been discussed with the patient and family. After consideration of risks, benefits and other options for treatment, the patient has consented to  Procedure(s): LAPAROSCOPIC CHOLECYSTECTOMY WITH INTRAOPERATIVE CHOLANGIOGRAM (N/A) as a surgical intervention .  The patient's history has been reviewed, patient examined, no change in status, stable for surgery.  I have reviewed the patient's chart and labs.  Questions were answered to the patient's satisfaction.     Vanesha Athens, Marta Lamas

## 2013-05-11 NOTE — Anesthesia Preprocedure Evaluation (Signed)
Anesthesia Evaluation  Patient identified by MRN, date of birth, ID band Patient awake    Reviewed: Allergy & Precautions, H&P , NPO status , Patient's Chart, lab work & pertinent test results  History of Anesthesia Complications Negative for: history of anesthetic complications  Airway Mallampati: I   Neck ROM: full    Dental no notable dental hx. (+) Teeth Intact   Pulmonary neg pulmonary ROS,  breath sounds clear to auscultation  Pulmonary exam normal       Cardiovascular negative cardio ROS  IRhythm:regular Rate:Normal     Neuro/Psych negative neurological ROS  negative psych ROS   GI/Hepatic negative GI ROS, Neg liver ROS,   Endo/Other  negative endocrine ROS  Renal/GU negative Renal ROS  negative genitourinary   Musculoskeletal   Abdominal   Peds  Hematology negative hematology ROS (+)   Anesthesia Other Findings   Reproductive/Obstetrics negative OB ROS                             Anesthesia Physical Anesthesia Plan  ASA: I  Anesthesia Plan: General and General ETT   Post-op Pain Management:    Induction:   Airway Management Planned:   Additional Equipment:   Intra-op Plan:   Post-operative Plan:   Informed Consent: I have reviewed the patients History and Physical, chart, labs and discussed the procedure including the risks, benefits and alternatives for the proposed anesthesia with the patient or authorized representative who has indicated his/her understanding and acceptance.     Plan Discussed with: CRNA and Surgeon  Anesthesia Plan Comments:         Anesthesia Quick Evaluation  

## 2013-05-11 NOTE — Progress Notes (Signed)
UR COMPLETED  

## 2013-05-11 NOTE — Anesthesia Procedure Notes (Signed)
Procedure Name: Intubation Date/Time: 05/11/2013 3:32 PM Performed by: Leona Singleton A Pre-anesthesia Checklist: Patient identified, Emergency Drugs available, Suction available and Patient being monitored Patient Re-evaluated:Patient Re-evaluated prior to inductionOxygen Delivery Method: Circle system utilized Preoxygenation: Pre-oxygenation with 100% oxygen Intubation Type: IV induction Ventilation: Mask ventilation without difficulty Laryngoscope Size: Mac and 3 Grade View: Grade I Tube type: Oral Tube size: 7.0 mm Number of attempts: 1 Airway Equipment and Method: Stylet Placement Confirmation: ETT inserted through vocal cords under direct vision,  positive ETCO2 and breath sounds checked- equal and bilateral Secured at: 22 cm Tube secured with: Tape Dental Injury: Teeth and Oropharynx as per pre-operative assessment

## 2013-05-12 MED ORDER — HYDROCODONE-ACETAMINOPHEN 5-325 MG PO TABS: 1.0000 | ORAL_TABLET | Freq: Four times a day (QID) | ORAL | Status: DC | PRN

## 2013-05-12 MED ORDER — HYDROCODONE-ACETAMINOPHEN 5-325 MG PO TABS
1.0000 | ORAL_TABLET | ORAL | Status: DC | PRN
Start: 2013-05-12 — End: 2013-05-12
  Administered 2013-05-12 (×2): 2 via ORAL
  Filled 2013-05-12 (×2): qty 2

## 2013-05-12 NOTE — Discharge Summary (Signed)
Physician Discharge Summary  Patient ID: Lori Roy MRN: [id number] DOB/AGE: [birth year] 27 y.o.  Admit date: 05/10/2013 Discharge date: 05/12/2013  Admitting Diagnosis: Epigastric Abdominal Pain Cholelithiasis Biliary Colic  Discharge Diagnosis Patient Active Problem List   Diagnosis Date Noted  . Cholelithiasis 05/10/2013  . SVD (spontaneous vaginal delivery) 07/13/2011  . History of migraines     Consultants None  Imaging: Dg Chest 2 View  05/10/2013   *RADIOLOGY REPORT*  Clinical Data: Chest pain  CHEST - 2 VIEW  Comparison: Chest radiograph 05/17/2012  Findings: Normal mediastinum and heart silhouette.  Costophrenic angles are clear.  No effusion, infiltrate, or pneumothorax.  IMPRESSION: No acute cardiopulmonary process.   Original Report Authenticated By: Genevive Bi, M.D.   Dg Cholangiogram Operative  05/11/2013   *RADIOLOGY REPORT*  Clinical Data: 27 year old female undergoing cholecystectomy.  INTRAOPERATIVE CHOLANGIOGRAM  Technique:  Multiple fluoroscopic spot radiographs were obtained during intraoperative cholangiogram and are submitted for interpretation post-operatively.  Comparison: Abdominal ultrasound 05/10/2013.  Fluoroscopic time 0 minutes 11 seconds.  Findings: Multiple intraoperative fluoroscopic images.  Surgical clips at the level of the cystic duct which has cannulated. Contrast injection demonstrates normal intra and extrahepatic biliary tree.  Prompt contrast transit to the duodenum.  No filling defect or extravasation.  IMPRESSION: Negative intraoperative cholangiogram.   Original Report Authenticated By: Erskine Speed, M.D.   US Abdomen Complete  05/10/2013   *RADIOLOGY REPORT*  Clinical Data:  Generalized abdominal pain.  COMPLETE ABDOMINAL ULTRASOUND  Comparison:  CT abdomen pelvis 05/17/2012.  Findings:  Gallbladder:  Shadowing gallstone in the neck of the gallbladder. Marked wall thickening and edema, measuring up to approximately 10 mm in  thickness.  Minimal pericholecystic fluid.  Negative sonographic Murphy's sign according to the ultrasound technologist.  Common bile duct:  Normal in caliber with maximum diameter approximating 7 mm.  No visible common duct stone.  Liver:  Diffusely increased and coarsened echotexture without focal parenchymal abnormality.  Patent portal vein with hepatopetal flow.  IVC:  Patent.  Pancreas:  Although the pancreas is difficult to visualize in its entirety, no focal pancreatic abnormality is identified.  Spleen:  Normal size and echotexture without focal parenchymal abnormality.  Right Kidney:  No hydronephrosis.  Well-preserved cortex.  No shadowing calculi.  Normal size and parenchymal echotexture without focal abnormalities.  Approximately 12.6 cm in length.  Left Kidney:  No hydronephrosis.  Well-preserved cortex.  No shadowing calculi.  Normal size and parenchymal echotexture without focal abnormalities.  Approximately 11.7 cm in length.  Abdominal aorta:  Normal in caliber throughout its visualized course in the abdomen without significant atherosclerosis.  IMPRESSION:  1.  Cholelithiasis, with an impacted gallstone in the gallbladder neck.  Marked gallbladder wall thickening and minimal pericholecystic fluid is consistent with cholecystitis. 2.  No biliary ductal dilation. 3.  Diffuse hepatic steatosis without focal hepatic parenchymal abnormality. 4.  Otherwise normal examination.   Original Report Authenticated By: Hulan Saas, M.D.    Procedures Dr. Frederik Schmidt (05/11/13) - Laparoscopic Cholecystectomy with [facility]   Hospital Course:  27 yo WF who presented to [facility] with nausea, diarrhea, and evening epigastric abdominal pain that radiated to her right mid-upper back for the past week.  Workup showed an ultrasound with cholelithiasis, with an impacted gallstone in the gallbladder neck.  Marked gallbladder wall thickening and minimal pericholecystic fluid is consistent with cholecystitis.   Patient was  admitted and underwent procedure listed above.  Tolerated procedure well and was transferred to the floor.  Diet  was advanced as tolerated.  On POD 1, pt c/o itchiness around incision sites and abdominal pain with more concentration in umbilical area.  The patient was voiding well, tolerating diet, ambulating well, pain well controlled, vital signs stable, incisions c/d/i and felt stable for discharge home.  Patient will follow up in our office in 3 weeks and knows to call with questions or concerns.  Physical Exam: General:  Alert, NAD, pleasant, comfortable Cardiac: RRR, No M/R/G Pulm CTA, No W/R/R Abd:  Soft, +BS, ND, mild tenderness primarily umbilical area, some erythema around incision sites most likely due to irritation from bandages/tape/glue.      Medication List    TAKE these medications       acetaminophen 500 MG tablet  Commonly known as:  TYLENOL  Take 500 mg by mouth every 6 (six) hours as needed for pain.     HYDROcodone-acetaminophen 5-325 MG per tablet  Commonly known as:  NORCO/VICODIN  Take 1-2 tablets by mouth every 6 (six) hours as needed.     ibuprofen 200 MG tablet  Commonly known as:  ADVIL,MOTRIN  Take 600 mg by mouth every 8 (eight) hours as needed for pain.     naproxen sodium 220 MG tablet  Commonly known as:  ANAPROX  Take 220 mg by mouth 2 (two) times daily as needed (pain).     ROLAIDS PO  Take 4 tablets by mouth daily as needed (for stomach indigestion).             Follow-up Information   Follow up with [facility] On 05/31/2013. (YOUR APPT IS AT 12:30PM, PLEASE ARRIVE AT 12:00PM FOR CHECK IN)    Contact information:   [address]   [place] [address] [phone]       Signed: Cephus Shelling [facility] [phone]  05/12/2013, 8:16 AM

## 2013-05-12 NOTE — Progress Notes (Signed)
Pt. discharged to floor,verbalized understanding of discharged instruction,medication,restriction,diet and follow up appointment.Baseline Vitals sign stable,Pt comfortable,no sign and symptom of distress. 

## 2013-05-13 ENCOUNTER — Encounter (HOSPITAL_COMMUNITY): Payer: Self-pay | Admitting: General Surgery

## 2013-05-31 ENCOUNTER — Encounter (INDEPENDENT_AMBULATORY_CARE_PROVIDER_SITE_OTHER): Payer: Self-pay

## 2013-05-31 ENCOUNTER — Ambulatory Visit (INDEPENDENT_AMBULATORY_CARE_PROVIDER_SITE_OTHER): Payer: Self-pay | Admitting: Internal Medicine

## 2013-05-31 VITALS — BP 128/82 | HR 72 | Resp 14 | Ht 62.0 in | Wt 178.0 lb

## 2013-05-31 DIAGNOSIS — K8010 Calculus of gallbladder with chronic cholecystitis without obstruction: Secondary | ICD-10-CM

## 2013-05-31 DIAGNOSIS — K8066 Calculus of gallbladder and bile duct with acute and chronic cholecystitis without obstruction: Secondary | ICD-10-CM

## 2013-05-31 NOTE — Progress Notes (Signed)
  Subjective: Pt returns to the clinic today after undergoing laparoscopic cholecystectomy on 05/11/13 by Dr. Lindie Spruce.  The patient is tolerating their diet well and is having no severe pain.  Bowel function is good.  No problems with the wounds.  Objective: Vital signs in last 24 hours: Reviewed  PE: Abd: soft, non-tender, +bs, incisions well healed  Lab Results:  No results found for this basename: WBC, HGB, HCT, PLT,  in the last 72 hours BMET No results found for this basename: NA, K, CL, CO2, GLUCOSE, BUN, CREATININE, CALCIUM,  in the last 72 hours PT/INR No results found for this basename: LABPROT, INR,  in the last 72 hours CMP     Component Value Date/Time   NA 137 05/11/2013 0510   K 3.8 05/11/2013 0510   CL 105 05/11/2013 0510   CO2 26 05/11/2013 0510   GLUCOSE 86 05/11/2013 0510   BUN 5* 05/11/2013 0510   CREATININE 0.63 05/11/2013 0510   CALCIUM 8.1* 05/11/2013 0510   PROT 7.6 05/10/2013 1213   ALBUMIN 4.0 05/10/2013 1213   AST 10 05/10/2013 1213   ALT 10 05/10/2013 1213   ALKPHOS 78 05/10/2013 1213   BILITOT 0.2* 05/10/2013 1213   GFRNONAA >90 05/11/2013 0510   GFRAA >90 05/11/2013 0510   Lipase  No results found for this basename: lipase       Studies/Results: No results found.  Anti-infectives: Anti-infectives   None       Assessment/Plan  1.  S/P Laparoscopic Cholecystectomy: doing well, may resume regular activity without restrictions, Pt will follow up with [place] PRN and knows to call with questions or concerns.     Lori Roy 05/31/2013

## 2013-05-31 NOTE — Patient Instructions (Addendum)
May resume regular activity without restrictions. Follow up as needed. Call with questions or concerns.  

## 2013-09-22 ENCOUNTER — Other Ambulatory Visit: Payer: Self-pay

## 2013-11-17 NOTE — L&D Delivery Note (Signed)
Delivery Note Arrived in MAU with urge to push. I escorted her with the RN to [facility].  Upon getting her into the bed, FHR noted to be in 80s with contraction and perineum was bulging with pt pushing involuntarily.   At 3:11 PM a viable and healthy female was delivered via Vaginal, Spontaneous Delivery (Presentation:Occiput Posterior).  APGAR: 9/9, ; weight .    Dr Senaida Ores arrived as baby was being delivered. She took over at that point. Placenta status: delivered by Dr Senaida Ores.  Cord: 3 vessels with the following complications: Foot cord x 1.  Cord pH: not done  Anesthesia: None  Episiotomy: none Lacerations: second degree Suture Repair: Exam done by DR Bernette Mayers. Blood Loss (mL): 350cc  Mom to postpartum.  Baby to Couplet care / Skin to Skin.  [facility] 07/31/2014, 3:27 PM  As above,  was called to attend patient when she was in MAU and found to be c/c/+3.  I arrived within 6 minutes and pt was just delivered with baby on the abdomen.  Cord clamped and cut by father.  I then delivered the placenta spontaneously and examined pt with a second degree laceration noted, confirmed by rectal exam. Repair then done with 2-0 vicryl to reinforce the sphincter and 3-0 vicryl for remainder.  EBL 350cc.

## 2014-02-02 LAB — OB RESULTS CONSOLE GC/CHLAMYDIA
CHLAMYDIA, DNA PROBE: NEGATIVE
Gonorrhea: NEGATIVE

## 2014-02-02 LAB — OB RESULTS CONSOLE VARICELLA ZOSTER ANTIBODY, IGG: Varicella: IMMUNE

## 2014-07-01 LAB — OB RESULTS CONSOLE GBS: STREP GROUP B AG: NEGATIVE

## 2014-07-31 ENCOUNTER — Inpatient Hospital Stay (HOSPITAL_COMMUNITY)
Admission: AD | Admit: 2014-07-31 | Discharge: 2014-07-31 | Disposition: A | Discharge status: Home / Self Care | Payer: Medicaid Other | Source: Ambulatory Visit | Attending: Obstetrics and Gynecology | Admitting: Obstetrics and Gynecology

## 2014-07-31 ENCOUNTER — Encounter (HOSPITAL_COMMUNITY): Payer: Self-pay | Admitting: *Deleted

## 2014-07-31 ENCOUNTER — Encounter (HOSPITAL_COMMUNITY): Payer: Self-pay

## 2014-07-31 ENCOUNTER — Inpatient Hospital Stay (HOSPITAL_COMMUNITY)
Admission: AD | Admit: 2014-07-31 | Discharge: 2014-08-02 | DRG: 775 | Disposition: A | Discharge status: Home / Self Care | Payer: Medicaid Other | Source: Ambulatory Visit | Attending: Obstetrics and Gynecology | Admitting: Obstetrics and Gynecology

## 2014-07-31 DIAGNOSIS — O99344 Other mental disorders complicating childbirth: Secondary | ICD-10-CM | POA: Diagnosis present

## 2014-07-31 DIAGNOSIS — F319 Bipolar disorder, unspecified: Secondary | ICD-10-CM | POA: Diagnosis present

## 2014-07-31 DIAGNOSIS — O479 False labor, unspecified: Secondary | ICD-10-CM | POA: Diagnosis present

## 2014-07-31 DIAGNOSIS — G43909 Migraine, unspecified, not intractable, without status migrainosus: Secondary | ICD-10-CM | POA: Diagnosis present

## 2014-07-31 LAB — TYPE AND SCREEN
ABO/RH(D): A POS
ANTIBODY SCREEN: NEGATIVE

## 2014-07-31 LAB — CBC
HCT: 32.4 % — ABNORMAL LOW (ref 36.0–46.0)
HEMOGLOBIN: 10.5 g/dL — AB (ref 12.0–15.0)
MCH: 26.5 pg (ref 26.0–34.0)
MCHC: 32.4 g/dL (ref 30.0–36.0)
MCV: 81.8 fL (ref 78.0–100.0)
Platelets: 349 10*3/uL (ref 150–400)
RBC: 3.96 MIL/uL (ref 3.87–5.11)
RDW: 15.1 % (ref 11.5–15.5)
WBC: 23.1 10*3/uL — ABNORMAL HIGH (ref 4.0–10.5)

## 2014-07-31 MED ORDER — OXYTOCIN 40 UNITS IN LACTATED RINGERS INFUSION - SIMPLE MED
62.5000 mL/h | INTRAVENOUS | Status: DC
  Filled 2014-07-31: qty 1000

## 2014-07-31 MED ORDER — LACTATED RINGERS IV SOLN: 500.0000 mL | INTRAVENOUS | Status: DC | PRN

## 2014-07-31 MED ORDER — ACETAMINOPHEN 325 MG PO TABS: 650.0000 mg | ORAL_TABLET | ORAL | Status: DC | PRN

## 2014-07-31 MED ORDER — CITRIC ACID-SODIUM CITRATE 334-500 MG/5ML PO SOLN: 30.0000 mL | ORAL | Status: DC | PRN

## 2014-07-31 MED ORDER — IBUPROFEN 600 MG PO TABS
600.0000 mg | ORAL_TABLET | Freq: Four times a day (QID) | ORAL | Status: DC
  Administered 2014-07-31 – 2014-08-02 (×5): 600 mg via ORAL
  Filled 2014-07-31 (×5): qty 1

## 2014-07-31 MED ORDER — OXYTOCIN 10 UNIT/ML IJ SOLN
INTRAMUSCULAR | Status: AC
  Administered 2014-07-31: 10 [IU]
  Filled 2014-07-31: qty 2

## 2014-07-31 MED ORDER — WITCH HAZEL-GLYCERIN EX PADS: 1.0000 "application " | MEDICATED_PAD | CUTANEOUS | Status: DC | PRN

## 2014-07-31 MED ORDER — LIDOCAINE HCL (PF) 1 % IJ SOLN
30.0000 mL | INTRAMUSCULAR | Status: DC | PRN
  Filled 2014-07-31: qty 30

## 2014-07-31 MED ORDER — OXYCODONE-ACETAMINOPHEN 5-325 MG PO TABS
2.0000 | ORAL_TABLET | Freq: Once | ORAL | Status: AC
  Administered 2014-07-31: 2 via ORAL

## 2014-07-31 MED ORDER — ONDANSETRON HCL 4 MG PO TABS: 4.0000 mg | ORAL_TABLET | ORAL | Status: DC | PRN

## 2014-07-31 MED ORDER — ZOLPIDEM TARTRATE 5 MG PO TABS: 5.0000 mg | ORAL_TABLET | Freq: Every evening | ORAL | Status: DC | PRN

## 2014-07-31 MED ORDER — INFLUENZA VAC SPLIT QUAD 0.5 ML IM SUSY
0.5000 mL | PREFILLED_SYRINGE | INTRAMUSCULAR | Status: AC
  Administered 2014-08-01: 0.5 mL via INTRAMUSCULAR

## 2014-07-31 MED ORDER — OXYCODONE-ACETAMINOPHEN 5-325 MG PO TABS
1.0000 | ORAL_TABLET | ORAL | Status: DC | PRN
  Administered 2014-07-31 – 2014-08-02 (×8): 1 via ORAL
  Filled 2014-07-31 (×8): qty 1

## 2014-07-31 MED ORDER — OXYCODONE-ACETAMINOPHEN 5-325 MG PO TABS
2.0000 | ORAL_TABLET | ORAL | Status: DC | PRN
  Administered 2014-08-01: 2 via ORAL
  Filled 2014-07-31: qty 2

## 2014-07-31 MED ORDER — OXYCODONE-ACETAMINOPHEN 5-325 MG PO TABS: 1.0000 | ORAL_TABLET | ORAL | Status: DC | PRN

## 2014-07-31 MED ORDER — ONDANSETRON HCL 4 MG/2ML IJ SOLN: 4.0000 mg | INTRAMUSCULAR | Status: DC | PRN

## 2014-07-31 MED ORDER — IBUPROFEN 600 MG PO TABS
600.0000 mg | ORAL_TABLET | Freq: Once | ORAL | Status: AC
  Administered 2014-07-31: 600 mg via ORAL
  Filled 2014-07-31: qty 1

## 2014-07-31 MED ORDER — SIMETHICONE 80 MG PO CHEW: 80.0000 mg | CHEWABLE_TABLET | ORAL | Status: DC | PRN

## 2014-07-31 MED ORDER — LACTATED RINGERS IV SOLN: INTRAVENOUS | Status: DC

## 2014-07-31 MED ORDER — SENNOSIDES-DOCUSATE SODIUM 8.6-50 MG PO TABS
2.0000 | ORAL_TABLET | ORAL | Status: DC
  Administered 2014-07-31 – 2014-08-01 (×2): 2 via ORAL
  Filled 2014-07-31 (×2): qty 2

## 2014-07-31 MED ORDER — OXYCODONE-ACETAMINOPHEN 5-325 MG PO TABS
2.0000 | ORAL_TABLET | ORAL | Status: DC | PRN
  Filled 2014-07-31: qty 2

## 2014-07-31 MED ORDER — PRENATAL MULTIVITAMIN CH
1.0000 | ORAL_TABLET | Freq: Every day | ORAL | Status: DC
  Administered 2014-08-01: 1 via ORAL
  Filled 2014-07-31: qty 1

## 2014-07-31 MED ORDER — TETANUS-DIPHTH-ACELL PERTUSSIS 5-2.5-18.5 LF-MCG/0.5 IM SUSP: 0.5000 mL | Freq: Once | INTRAMUSCULAR | Status: DC

## 2014-07-31 MED ORDER — DIBUCAINE 1 % RE OINT: 1.0000 "application " | TOPICAL_OINTMENT | RECTAL | Status: DC | PRN

## 2014-07-31 MED ORDER — DIPHENHYDRAMINE HCL 25 MG PO CAPS: 25.0000 mg | ORAL_CAPSULE | Freq: Four times a day (QID) | ORAL | Status: DC | PRN

## 2014-07-31 MED ORDER — LANOLIN HYDROUS EX OINT: TOPICAL_OINTMENT | CUTANEOUS | Status: DC | PRN

## 2014-07-31 MED ORDER — ONDANSETRON HCL 4 MG/2ML IJ SOLN: 4.0000 mg | Freq: Four times a day (QID) | INTRAMUSCULAR | Status: DC | PRN

## 2014-07-31 MED ORDER — BENZOCAINE-MENTHOL 20-0.5 % EX AERO
1.0000 "application " | INHALATION_SPRAY | CUTANEOUS | Status: DC | PRN
  Administered 2014-08-01: 1 via TOPICAL
  Filled 2014-07-31: qty 56

## 2014-07-31 MED ORDER — OXYTOCIN BOLUS FROM INFUSION: 500.0000 mL | INTRAVENOUS | Status: DC

## 2014-07-31 NOTE — MAU Note (Signed)
Ctx 2-5 minutes x 2 hours. Denies LOF or VB. Positive fetal movement.

## 2014-07-31 NOTE — H&P (Signed)
Lori Roy is a 28 y.o. female G2P1001 at 40+ weeks (EDD 07/31/14 by LMP c/w 15 week [place])  presented to MAU completely dilated and delivering.  Pt had been evaluated in MAU earlier in the AM and d/c home with no cervical change.  She went home and contractions continued, becoming most intense about 5 hours after d/c.  She returned to hospital and in MAU was found to be c/c/+3 and involuntarily pushing.  She was immediately transferred to L&D and within 3 minutes of arrival the CNM attended the delivery of the infant and I arrived within a minute of delivery.   Her prenatal care was complicated only by a history of bipolar disorder, stable off meds, and migraines.   Maternal Medical History:  Reason for admission: Contractions.   Contractions: Onset was 6-12 hours ago.   Frequency: regular.   Perceived severity is strong.    Fetal activity: Perceived fetal activity is normal.    Prenatal Complications - Diabetes: none.    OB History   Grav Para Term Preterm Abortions TAB SAB Ect Mult Living   0 0 0 0 0 0 1     Past Medical History  Diagnosis Date  . Normal pregnancy 07/12/2011  . SROM (spontaneous rupture of membranes) 07/12/2011  . SVD (spontaneous vaginal delivery) 07/13/2011  . Iron deficiency anemia   . Migraines   . Anxiety    Past Surgical History  Procedure Laterality Date  . Wisdom tooth extraction  ~ 2006  . Cholecystectomy N/A 05/11/2013    Procedure: LAPAROSCOPIC CHOLECYSTECTOMY WITH INTRAOPERATIVE CHOLANGIOGRAM;  Surgeon: Cherylynn Ridges, MD;  Location: [facility] OR;  Service: General;  Laterality: N/A;   Family History: family history is not on file. Social History:  reports that she quit smoking about 3 years ago. Her smoking use included Cigarettes. She has a .3 pack-year smoking history. She has never used smokeless tobacco. She reports that she uses illicit drugs (Marijuana). She reports that she does not drink alcohol.   Prenatal Transfer Tool  Maternal  Diabetes: No Genetic Screening: Normal Maternal Ultrasounds/Referrals: Normal Fetal Ultrasounds or other Referrals:  None Maternal Substance Abuse:  Yes:  Type: Marijuana Significant Maternal Medications:  None Significant Maternal Lab Results:  None Other Comments:  None  ROS  Dilation: 10 Effacement (%): 100 Station: Crowning Exam by:: Williams CNM Blood pressure 140/84, pulse 125, resp. rate 18, height  (1.575 m), weight 95.709 kg (211 lb). Maternal Exam:  Uterine Assessment: Contraction strength is firm.  Contraction frequency is regular.   Abdomen: Patient reports no abdominal tenderness. Fetal presentation: vertex  Introitus: Normal vulva. Normal vagina.  Pelvis: adequate for delivery.      Physical Exam  Constitutional: She appears well-developed.  Cardiovascular: Normal rate and regular rhythm.   Respiratory: Effort normal.  GI: Soft.  Genitourinary: Vagina normal.  Neurological: She is alert.  Psychiatric: She has a normal mood and affect.    Prenatal labs: ABO, Rh:  A positive Antibody:  Negative Rubella:  Immune RPR:   NR HBsAg:   Neg HIV:   NR GBS: Negative (08/15 0000)  One hour GTT 132 Quad screen negative  CF negative  Assessment/Plan: Pt arrived and delivered precipitously.  Please see delivery note for details.  Oliver Pila 07/31/2014, 4:02 PM

## 2014-07-31 NOTE — MAU Note (Signed)
Patient bypassed triage directly to MAU bed; patient insisted needing to go to bathroom before going to room. States drank Milk of Magnesia prior to arrival and needed to go to bathroom. RN encouraged patient to be checked but refuses at this time. At 1455 patient came out of bathroom and states she feels lots of pressure. Rn checked patient; SVE 10cm/100%/+2 and involuntarily pushing. Lori Roy CNM at bedside; 2nd RN to call Dr. Senaida Ores. Patient transported to room 163 via stretcher accompanied by RN and CNM.

## 2014-07-31 NOTE — Discharge Instructions (Signed)

## 2014-08-01 LAB — RPR

## 2014-08-01 LAB — CBC
HCT: 29.9 % — ABNORMAL LOW (ref 36.0–46.0)
Hemoglobin: 9.5 g/dL — ABNORMAL LOW (ref 12.0–15.0)
MCH: 26.4 pg (ref 26.0–34.0)
MCHC: 31.8 g/dL (ref 30.0–36.0)
MCV: 83.1 fL (ref 78.0–100.0)
Platelets: 345 10*3/uL (ref 150–400)
RBC: 3.6 MIL/uL — AB (ref 3.87–5.11)
RDW: 15.3 % (ref 11.5–15.5)
WBC: 15.7 10*3/uL — ABNORMAL HIGH (ref 4.0–10.5)

## 2014-08-01 LAB — ABO/RH: ABO/RH(D): A POS

## 2014-08-01 NOTE — Lactation Note (Signed)
This note was copied from the chart of Lori Roy. Lactation Consultation Note  Patient Name: Lori Roy [id number] Date: 08/01/2014 Reason for consult: Initial assessment of this mom and baby at 29 hours postpartum. This is mom's second baby but she did not breastfeed first, stating unable to latch or pump after first few days at home.This newborn is now latching well and mom says her nurse has shown her hand expression and assisted her with latching as needed today.   Baby has been exclusively breastfeeding for 10-30 minute feedings and having copious output, both voids and stools.  LC encouraged STS and cue feedings ad lib.   LC provided [employer] brochure and reviewed [facility] services and list of community and web site resources. LC encouraged review of Baby and Me pp 9, 14 and 20-25 for STS and BF information.   Maternal Data Formula Feeding for Exclusion: No Has patient been taught Hand Expression?: Yes (mom states her nurse has shown her hand expression) Does the patient have breastfeeding experience prior to this delivery?: Yes  Feeding Feeding Type: Breast Fed Length of feed: 30 min  LATCH Score/Interventions Latch: Repeated attempts needed to sustain latch, nipple held in mouth throughout feeding, stimulation needed to elicit sucking reflex. Intervention(s): Adjust position;Assist with latch  Audible Swallowing: A few with stimulation Intervention(s): Skin to skin  Type of Nipple: Everted at rest and after stimulation  Comfort (Breast/Nipple): Soft / non-tender     Hold (Positioning): Assistance needed to correctly position infant at breast and maintain latch. Intervention(s): Breastfeeding basics reviewed;Skin to skin  LATCH Score: 7 (previous feeding assessment by RN)  Lactation Tools Discussed/Used   STS, hand expression, cue feeding  Consult Status Consult Status: Follow-up Date: 08/02/14 Follow-up type: In-patient    Warrick Parisian  [facility] 08/01/2014, 8:46 PM

## 2014-08-01 NOTE — Progress Notes (Signed)
Clinical Social Work Department PSYCHOSOCIAL ASSESSMENT - MATERNAL/CHILD 08/01/2014  Patient:  Lori Roy,Lori Roy  Account Number:  [id number]  Admit Date:  07/31/2014  Childs Name:   Lori Roy   Clinical Social Worker:  Deborah Dondero, CLINICAL SOCIAL WORKER   Date/Time:  08/01/2014 10:45 AM  Date Referred:  07/31/2014   Referral source  Central Nursery     Referred reason  Behavioral Health Issues  Substance Abuse   Other referral source:    I:  FAMILY / HOME ENVIRONMENT Child's legal guardian:  PARENT  Guardian - Name Guardian - Age Guardian - Address  Emberlie Heft [address] [place], [place] [address]   Other household support members/support persons Name Relationship DOB  Cameron SON 3 years old  Ben Gaiton SIGNIFICANT OTHER    Other support:   MOB stated that the FOB is not involved, and will not be signing the birth certificate.  She shared that she feels well suported by her boyfriend and her mother.  Per MOB, her mother lives nearby and is actively involved in her life.    II  PSYCHOSOCIAL DATA Information Source:  Patient Interview  Financial and Community Resources Employment:   MOB stated that during her pregnancy, she completed classes to become a CNA.  She stated that she will be searching for employment once she recovers from pregnancy.   Financial resources:  Medicaid If Medicaid - County:  [place] Other  Food Stamps  WIC   School / Grade:  N/A Maternity Care Coordinator / Child Services Coordination / Early Interventions:   N/A  Cultural issues impacting care:   None reported    III  STRENGTHS Strengths  Adequate Resources  Home prepared for Child (including basic supplies)  Supportive family/friends   Strength comment:    IV  RISK FACTORS AND CURRENT PROBLEMS Current Problem:  YES   Risk Factor & Current Problem Patient Issue Family Issue Risk Factor / Current Problem Comment  Mental Illness N N MOB presents with a  history of anxiety, depression, and bipoloar.  She stated that she was prescribed medication prior to pregnancy and was participating in therapy, but that she stopped medications when she learned that she was pregnant.  She denied any acute symptoms during her pregnancy.  Substance Abuse N N MOB denied THC use during her pregnancy.  She stated that she has smoked THC in 2-3 years.    V  SOCIAL WORK ASSESSMENT CSW met with the MOB in her room in order to complete the assessment. Consult was ordered due to MOB presenting with a mental health history significant for bipolar and THC use.  MOB was easily engaged and receptive to the CSW visit.  She openly discussed the reasons for CSW consult, was receptive to processing her thoughts and feelings, and thanked CSW for the visit. MOB displayed a full range in affect and smiled frequently when she was interacting with the baby.  MOB's significant other was in the room, but he presented as sleeping and did not participate in the assessment.  MOB did not present with any acute mental health symptoms.   MOB expressed excitement as she transitions into the postpartum period.  She smiled as she looks forward to watching her 3 year old son interact with her daughter.  MOB stated that she feels well supported by her significant other and her mother.  Per MOB, her mother lives with 5 minutes of her home and has no difficulties asking for help if warranted.    CSW inquired about psychosocial stressors during her pregnancy that may impact her transition into the postpartum period.  MOB reflected upon feelings of guilt since she ended the relationship with the FOB early in her pregnancy.  She stated that she felt guilty since she ended the relationship since she did not believe that it was a healthy relationship for her and she thought that his presence may have a negative impact on their daughter.  She stated that he was involved in her prenatal visits initially, but that he has  become detached and uninvolved as the pregnancy progressed.  She denied this being a current stressor and denied feeling any resentment toward the FOB since she feels supported by her significant other, who is also the FOB of her 3 year old son.  She stated that he loves her daughter as if it was his own.    CSW inquired about mental health history. MOB acknowledged family history of anxiety and bipolar, and she stated that she has always felt like she had anxiety and depression.  She stated that she was diagnosed with bipolar in 2012, but that it was considered "mild".  CSW noted that MOB is receptive to accepting her diagnoses since she has previously received therapy and medication management at [facility] to address her symptoms.  MOB reflected upon efficacy of therapy to assist her develop emotional regulation skills and to identify irrational thoughts.  She stated that she was prescribed Xanax PRN, but that she stopped medications when she learned that she was pregnant.  She only endorsed symptoms of anxiety during her pregnancy, and denied any mood swings that would demonstrate presence of depression or bipolar. MOB is aware of her ability to return to [facility] if symptoms return, and she is aware of the importance of intervening before symptoms escalate since if she needs to re-start medication she needs sufficient time to receive therapeutic benefit of her medications.  MOB verbalized understanding of how untreated mental health symptoms would negatively impact her ability for her to be the mother she wants to be. MOB and CSW continued to explore her anxiety and ways to engage in self-talk to decrease feelings.  MOB was receptive to interventions on how to de-catastrophize the situation and to realize that she is able to cope if some of her fears occur.  CSW praised MOB's efforts to work through anxiety instead of being in a state of denial about her symptoms.  MOB smiled and stated that it continues to be an  area of growth for her, but shared that she has worked hard to control her anxiety.   MOB denied recent THC use.  She stated that she smoked 2-3 years ago, and denied any use during her pregnancy.   No barriers to discharge.   VI SOCIAL WORK PLAN Social Work Plan  Information/Referral to Community Resources  Patient/Family Education  No Further Intervention Required / No Barriers to Discharge   Type of pt/family education:   Postpartum depression and anxiety  Hospital drug screen policy   If child protective services report - county:   If child protective services report - date:   Information/referral to community resources comment:   CSW reviewed with MOB how to access mental health services at [facility] as MOB has previously received care at [facility].   Other social work plan:   CSW to provide ongoing emotional support PRN.     

## 2014-08-01 NOTE — Progress Notes (Signed)
Post Partum Day1 Subjective: no complaints and tolerating PO  Objective: Blood pressure 125/71, pulse 82, temperature 98.1 F (36.7 C), temperature source Oral, resp. rate 18, height  (1.575 m), weight 95.709 kg (211 lb), unknown if currently breastfeeding.  Physical Exam:  General: alert and cooperative Lochia: appropriate Uterine Fundus: firm    Recent Labs  07/31/14 1629 08/01/14 0545  HGB 10.5* 9.5*  HCT 32.4* 29.9*    Assessment/Plan: Plan for discharge tomorrow   LOS: 1 day   Laurie Penado W 08/01/2014, 9:15 AM

## 2014-08-01 NOTE — Discharge Summary (Signed)
Obstetric Discharge Summary Reason for Admission: onset of labor Prenatal Procedures: none Intrapartum Procedures: spontaneous vaginal delivery, precipitous Postpartum Procedures: none Complications-Operative and Postpartum: second degree perineal laceration Hemoglobin  Date Value Ref Range Status  08/01/2014 9.5* 12.0 - 15.0 g/dL Final     HCT  Date Value Ref Range Status  08/01/2014 29.9* 36.0 - 46.0 % Final    Physical Exam:  General: alert and cooperative Lochia: appropriate Uterine Fundus: firm   Discharge Diagnoses: Term Pregnancy-delivered  Discharge Information: Date: 08/02/2014 Activity: pelvic rest Diet: routine Medications: Ibuprofen and Percocet Condition: improved Instructions: refer to practice specific booklet Discharge to: home Follow-up Information   Follow up with Oliver Pila, MD In 6 weeks.   Specialty:  Obstetrics and Gynecology   Contact information:   [address] [place] [address] [phone]       Newborn Data: Live born female  Birth Weight: 6 lb 6.8 oz (2914 g) APGAR: 9, 9  Home with mother.  Oliver Pila 08/02/2014, 8:09 AM

## 2014-08-01 NOTE — Progress Notes (Signed)
Admission nutrition screen triggered for weight loss > 10 lbs. Patients chart reviewed and assessed  for nutritional risk. PNR indicates steady weight gain. Patient is determined to be at low nutrition  risk.   Elisabeth Cara M.Odis Luster LDN Neonatal Nutrition Support Specialist/RD III Pager [phone]

## 2014-08-02 MED ORDER — IBUPROFEN 600 MG PO TABS: 600.0000 mg | ORAL_TABLET | Freq: Four times a day (QID) | ORAL | Status: DC

## 2014-08-02 MED ORDER — OXYCODONE-ACETAMINOPHEN 5-325 MG PO TABS: 1.0000 | ORAL_TABLET | ORAL | Status: DC | PRN

## 2014-08-02 NOTE — Progress Notes (Signed)
Post Partum Day 2 Subjective: no complaints, voiding and tolerating PO  Objective: Blood pressure 118/63, pulse 66, temperature 97.8 F (36.6 C), temperature source Oral, resp. rate 18, height  (1.575 m), weight 95.709 kg (211 lb), SpO2 99.00%, unknown if currently breastfeeding.  Physical Exam:  General: alert and cooperative Lochia: appropriate Uterine Fundus: firm    Recent Labs  07/31/14 1629 08/01/14 0545  HGB 10.5* 9.5*  HCT 32.4* 29.9*    Assessment/Plan: Discharge home   LOS: 2 days   Lori Roy W 08/02/2014, 8:07 AM

## 2014-08-02 NOTE — Lactation Note (Signed)
This note was copied from the chart of Girl Ashelyn Mccravy. Lactation Consultation Note  Patient Name: Girl Alannie Amodio [id number] Date: 08/02/2014 Reason for consult: Follow-up assessment Per mom breast feeding is going well , breast are feeling heavier and nipples tender initially with latch . With moms permission , assessed breast tissue , nipples normal skin color, no breakdown noted. LC reviewed basics - breast massage , hand express, breast compressions with latch.  With hand expressing , steady flow of transitional milk noted. LC encouraged mom to use EBM on the nipples. Reviewed sore nipple and engorgement prevention and tx , instructed mom on the use comfort gels , hand pump, Increased flange to #27 for when the milk comes in. Referring mom to the baby and me booklet. Mother informed of post-discharge support and given phone number to the lactation department, including services for  phone call assistance; out-patient appointments; and breastfeeding support group. List of other breastfeeding resources  in the community given in the handout. Encouraged mother to call for problems or concerns related to breastfeeding.    Maternal Data Does the patient have breastfeeding experience prior to this delivery?: Yes  Feeding Feeding Type: Breast Fed Length of feed: 30 min (per  mom )  LATCH Score/Interventions                Intervention(s): Breastfeeding basics reviewed     Lactation Tools Discussed/Used Tools: Pump;Flanges Flange Size: 27 Breast pump type: Manual WIC Program: Yes (per mom [place] ) Pump Review: Setup, frequency, and cleaning;Milk Storage Initiated by:: MAI  Date initiated:: 08/02/14   Consult Status Consult Status: Complete Date: 08/02/14    Kathrin Greathouse 08/02/2014, 9:48 AM

## 2014-09-18 ENCOUNTER — Encounter (HOSPITAL_COMMUNITY): Payer: Self-pay | Admitting: *Deleted

## 2015-12-05 ENCOUNTER — Emergency Department (HOSPITAL_COMMUNITY): Payer: Medicaid Other

## 2015-12-05 ENCOUNTER — Encounter (HOSPITAL_COMMUNITY): Payer: Self-pay | Admitting: *Deleted

## 2015-12-05 ENCOUNTER — Emergency Department (HOSPITAL_COMMUNITY)
Admission: EM | Admit: 2015-12-05 | Discharge: 2015-12-05 | Disposition: A | Discharge status: Home / Self Care | Payer: Medicaid Other | Attending: Emergency Medicine | Admitting: Emergency Medicine

## 2015-12-05 DIAGNOSIS — Z87891 Personal history of nicotine dependence: Secondary | ICD-10-CM | POA: Insufficient documentation

## 2015-12-05 DIAGNOSIS — R197 Diarrhea, unspecified: Secondary | ICD-10-CM | POA: Insufficient documentation

## 2015-12-05 DIAGNOSIS — D509 Iron deficiency anemia, unspecified: Secondary | ICD-10-CM | POA: Insufficient documentation

## 2015-12-05 DIAGNOSIS — Z79899 Other long term (current) drug therapy: Secondary | ICD-10-CM | POA: Insufficient documentation

## 2015-12-05 DIAGNOSIS — Z8679 Personal history of other diseases of the circulatory system: Secondary | ICD-10-CM | POA: Insufficient documentation

## 2015-12-05 DIAGNOSIS — Z3202 Encounter for pregnancy test, result negative: Secondary | ICD-10-CM | POA: Insufficient documentation

## 2015-12-05 DIAGNOSIS — N390 Urinary tract infection, site not specified: Secondary | ICD-10-CM

## 2015-12-05 DIAGNOSIS — Z8659 Personal history of other mental and behavioral disorders: Secondary | ICD-10-CM | POA: Insufficient documentation

## 2015-12-05 DIAGNOSIS — R112 Nausea with vomiting, unspecified: Secondary | ICD-10-CM | POA: Insufficient documentation

## 2015-12-05 DIAGNOSIS — R102 Pelvic and perineal pain: Secondary | ICD-10-CM

## 2015-12-05 LAB — CBC
HEMATOCRIT: 39.4 % (ref 36.0–46.0)
Hemoglobin: 12.5 g/dL (ref 12.0–15.0)
MCH: 26 pg (ref 26.0–34.0)
MCHC: 31.7 g/dL (ref 30.0–36.0)
MCV: 81.9 fL (ref 78.0–100.0)
Platelets: 319 10*3/uL (ref 150–400)
RBC: 4.81 MIL/uL (ref 3.87–5.11)
RDW: 14.7 % (ref 11.5–15.5)
WBC: 8.7 10*3/uL (ref 4.0–10.5)

## 2015-12-05 LAB — URINALYSIS, ROUTINE W REFLEX MICROSCOPIC
Bilirubin Urine: NEGATIVE
GLUCOSE, UA: NEGATIVE mg/dL
HGB URINE DIPSTICK: NEGATIVE
KETONES UR: NEGATIVE mg/dL
Nitrite: NEGATIVE
PH: 6 (ref 5.0–8.0)
Protein, ur: NEGATIVE mg/dL
Specific Gravity, Urine: 1.017 (ref 1.005–1.030)

## 2015-12-05 LAB — I-STAT BETA HCG BLOOD, ED (MC, WL, AP ONLY): I-stat hCG, quantitative: <5 m[IU]/mL (ref <5)

## 2015-12-05 LAB — COMPREHENSIVE METABOLIC PANEL
ALBUMIN: 4.2 g/dL (ref 3.5–5.0)
ALT: 12 U/L — AB (ref 14–54)
AST: 14 U/L — AB (ref 15–41)
Alkaline Phosphatase: 71 U/L (ref 38–126)
Anion gap: 7 (ref 5–15)
BUN: 5 mg/dL — AB (ref 6–20)
CHLORIDE: 106 mmol/L (ref 101–111)
CO2: 25 mmol/L (ref 22–32)
CREATININE: 0.66 mg/dL (ref 0.44–1.00)
Calcium: 9.6 mg/dL (ref 8.9–10.3)
GFR calc Af Amer: >60 mL/min (ref >60)
GFR calc non Af Amer: >60 mL/min (ref >60)
GLUCOSE: 101 mg/dL — AB (ref 65–99)
POTASSIUM: 4.2 mmol/L (ref 3.5–5.1)
SODIUM: 138 mmol/L (ref 135–145)
Total Bilirubin: 0.3 mg/dL (ref 0.3–1.2)
Total Protein: 7.4 g/dL (ref 6.5–8.1)

## 2015-12-05 LAB — URINE MICROSCOPIC-ADD ON

## 2015-12-05 LAB — WET PREP, GENITAL
SPERM: NONE SEEN
TRICH WET PREP: NONE SEEN
YEAST WET PREP: NONE SEEN

## 2015-12-05 LAB — LIPASE, BLOOD: LIPASE: 22 U/L (ref 11–51)

## 2015-12-05 MED ORDER — SULFAMETHOXAZOLE-TRIMETHOPRIM 800-160 MG PO TABS: 1.0000 | ORAL_TABLET | Freq: Two times a day (BID) | ORAL | Status: AC

## 2015-12-05 MED ORDER — KETOROLAC TROMETHAMINE 60 MG/2ML IM SOLN
60.0000 mg | Freq: Once | INTRAMUSCULAR | Status: AC
  Administered 2015-12-05: 60 mg via INTRAMUSCULAR
  Filled 2015-12-05: qty 2

## 2015-12-05 MED ORDER — PHENAZOPYRIDINE HCL 200 MG PO TABS: 200.0000 mg | ORAL_TABLET | Freq: Three times a day (TID) | ORAL | Status: DC

## 2015-12-05 MED ORDER — ONDANSETRON HCL 4 MG PO TABS: 4.0000 mg | ORAL_TABLET | Freq: Four times a day (QID) | ORAL | Status: DC

## 2015-12-05 MED ORDER — ONDANSETRON 4 MG PO TBDP
4.0000 mg | ORAL_TABLET | Freq: Once | ORAL | Status: AC
  Administered 2015-12-05: 4 mg via ORAL
  Filled 2015-12-05: qty 1

## 2015-12-05 NOTE — ED Notes (Signed)
Pt reports severe abd pain x 8 days, radiates into her back. Having n/v/d. Denies urinary symptoms.

## 2015-12-05 NOTE — ED Notes (Signed)
Brought patient back to room; patient attempting to provide an urine specimen

## 2015-12-05 NOTE — ED Notes (Signed)
Pt stable, ambulatory, states understanding of discharge instructions 

## 2015-12-05 NOTE — ED Provider Notes (Signed)
CSN: [id number]     Arrival date & time 12/05/15  1136 History   By signing my name below, I, Emmanuella Mensah, attest that this documentation has been prepared under the direction and in the presence of [facility], [place]. Electronically Signed: Angelene Giovanni, ED Scribe. 12/05/2015. 4:10 PM.     Chief Complaint  Patient presents with  . Abdominal Pain   Patient is a 30 y.o. female presenting with abdominal pain. The history is provided by the patient. No language interpreter was used.  Abdominal Pain Pain location:  L flank, LLQ and LUQ Pain radiates to:  Back Pain severity:  Moderate Onset quality:  Gradual Duration:  8 days Timing:  Constant Progression:  Worsening Chronicity:  New Relieved by:  Nothing Worsened by:  Nothing tried Ineffective treatments:  None tried Associated symptoms: diarrhea, fever (low grade), nausea and vomiting   Associated symptoms: no chills, no dysuria and no vaginal bleeding    HPI Comments: ADONNA HORSLEY is a 30 y.o. female who presents to the Emergency Department complaining of gradually worsening moderate lower abdominal pain that radiates towards her back onset several days ago. She reports associated low grade fever. No alleviating factors noted. Pt has not tried any medications for her symptoms. She denies any similar hx in the past. Pt is currently on Zoloft and denies any new medications. Pt's LNMP was 11/19/15. She denies any dysuria or urinary frequency.    Past Medical History  Diagnosis Date  . Normal pregnancy 07/12/2011  . SROM (spontaneous rupture of membranes) 07/12/2011  . SVD (spontaneous vaginal delivery) 07/13/2011  . Iron deficiency anemia   . Migraines   . Anxiety    Past Surgical History  Procedure Laterality Date  . Wisdom tooth extraction  ~ 2006  . Cholecystectomy N/A 05/11/2013    Procedure: LAPAROSCOPIC CHOLECYSTECTOMY WITH INTRAOPERATIVE CHOLANGIOGRAM;  Surgeon: Cherylynn Ridges, MD;  [facility];  Service:  General;  Laterality: N/A;   History reviewed. No pertinent family history. Social History  Substance Use Topics  . Smoking status: Former Smoker -- 0.10 packs/day for 3 years    Types: Cigarettes    Quit date: 11/17/2010  . Smokeless tobacco: Never Used  . Alcohol Use: No   OB History    Gravida Para Term Preterm AB TAB SAB Ectopic Multiple Living   2 2 2  0 0 0 0 0 0 2     Review of Systems  Constitutional: Positive for fever (low grade). Negative for chills.  Gastrointestinal: Positive for nausea, vomiting, abdominal pain and diarrhea.  Genitourinary: Negative for dysuria, frequency and vaginal bleeding.  All other systems reviewed and are negative.     Allergies  Review of patient's allergies indicates no known allergies.  Home Medications   Prior to Admission medications   Medication Sig Start Date End Date Taking? Authorizing Provider  ibuprofen (ADVIL,MOTRIN) 600 MG tablet Take 1 tablet (600 mg total) by mouth every 6 (six) hours. 08/02/14   Huel Cote, MD  ondansetron (ZOFRAN) 4 MG tablet Take 1 tablet (4 mg total) by mouth every 6 (six) hours. 12/05/15   Hope Orlene Och, NP  oxyCODONE-acetaminophen (PERCOCET/ROXICET) 5-325 MG per tablet Take 1 tablet by mouth every 4 (four) hours as needed (for pain scale less than 7). 08/02/14   Huel Cote, MD  phenazopyridine (PYRIDIUM) 200 MG tablet Take 1 tablet (200 mg total) by mouth 3 (three) times daily. 12/05/15   Hope Orlene Och, NP  Prenatal Vit-Fe Fumarate-FA (PRENATAL  MULTIVITAMIN) TABS tablet Take 1 tablet by mouth daily at 12 noon.    Historical Provider, MD  sulfamethoxazole-trimethoprim (BACTRIM DS,SEPTRA DS) 800-160 MG tablet Take 1 tablet by mouth 2 (two) times daily. 12/05/15 12/12/15  Hope Orlene Och, NP   BP 135/81 mmHg  Pulse 73  Temp(Src) 98.4 F (36.9 C) (Oral)  Resp 18  Ht  (1.549 m)  Wt 85.276 kg  BMI 35.54 kg/m2  SpO2 99%  LMP 11/19/2015 Physical Exam  Constitutional: She is oriented to person,  place, and time. She appears well-developed and well-nourished. No distress.  HENT:  Head: Normocephalic and atraumatic.  Cardiovascular: Normal rate and regular rhythm.   Pulmonary/Chest: Effort normal and breath sounds normal.  Abdominal: Soft. Bowel sounds are normal. She exhibits no distension. There is tenderness in the suprapubic area. There is no rebound and no guarding. CVA tenderness: mild left.  Genitourinary:  External genitalia without lesions, white d/c vaginal vault, mild CMT and bilateral adnexal tenderness, no mass palpated, uterus without palpable enlargement.   Musculoskeletal: Normal range of motion. She exhibits no edema.  Neurological: She is alert and oriented to person, place, and time.  Skin: Skin is warm and dry.  Psychiatric: She has a normal mood and affect. Her behavior is normal.  Nursing note and vitals reviewed.   ED Course  Procedures (including critical care time) DIAGNOSTIC STUDIES: Oxygen Saturation is 97% on RA, normal by my interpretation.    COORDINATION OF CARE: 3:56 PM- Pt advised of plan for treatment and pt agrees. Pt will receive a pelvic examination for further evaluation.   Labs Review Results for orders placed or performed during the hospital encounter of 12/05/15 (from the past 24 hour(s))  Lipase, blood     Status: None   Collection Time: 12/05/15 12:10 PM  Result Value Ref Range   Lipase 22 11 - 51 U/L  Comprehensive metabolic panel     Status: Abnormal   Collection Time: 12/05/15 12:10 PM  Result Value Ref Range   Sodium 138 135 - 145 mmol/L   Potassium 4.2 3.5 - 5.1 mmol/L   Chloride 106 101 - 111 mmol/L   CO2 25 22 - 32 mmol/L   Glucose, Bld 101 (H) 65 - 99 mg/dL   BUN 5 (L) 6 - 20 mg/dL   Creatinine, Ser 1.61 0.44 - 1.00 mg/dL   Calcium 9.6 8.9 - 09.6 mg/dL   Total Protein 7.4 6.5 - 8.1 g/dL   Albumin 4.2 3.5 - 5.0 g/dL   AST 14 (L) 15 - 41 U/L   ALT 12 (L) 14 - 54 U/L   Alkaline Phosphatase 71 38 - 126 U/L   Total  Bilirubin 0.3 0.3 - 1.2 mg/dL   GFR calc non Af Amer >60 >60 mL/min   GFR calc Af Amer >60 >60 mL/min   Anion gap 7 5 - 15  CBC     Status: None   Collection Time: 12/05/15 12:10 PM  Result Value Ref Range   WBC 8.7 4.0 - 10.5 K/uL   RBC 4.81 3.87 - 5.11 MIL/uL   Hemoglobin 12.5 12.0 - 15.0 g/dL   HCT 04.5 40.9 - 81.1 %   MCV 81.9 78.0 - 100.0 fL   MCH 26.0 26.0 - 34.0 pg   MCHC 31.7 30.0 - 36.0 g/dL   RDW 91.4 78.2 - 95.6 %   Platelets 319 150 - 400 K/uL  I-Stat beta hCG blood, ED ([facility], [facility], [facility] only)  Status: None   Collection Time: 12/05/15 12:24 PM  Result Value Ref Range   I-stat hCG, quantitative <5.0 <5 mIU/mL   Comment 3          Urinalysis, Routine w reflex microscopic (not at [facility])     Status: Abnormal   Collection Time: 12/05/15  3:44 PM  Result Value Ref Range   Color, Urine YELLOW YELLOW   APPearance CLOUDY (A) CLEAR   Specific Gravity, Urine 1.017 1.005 - 1.030   pH 6.0 5.0 - 8.0   Glucose, UA NEGATIVE NEGATIVE mg/dL   Hgb urine dipstick NEGATIVE NEGATIVE   Bilirubin Urine NEGATIVE NEGATIVE   Ketones, ur NEGATIVE NEGATIVE mg/dL   Protein, ur NEGATIVE NEGATIVE mg/dL   Nitrite NEGATIVE NEGATIVE   Leukocytes, UA LARGE (A) NEGATIVE  Urine microscopic-add on     Status: Abnormal   Collection Time: 12/05/15  3:44 PM  Result Value Ref Range   Squamous Epithelial / LPF TOO NUMEROUS TO COUNT (A) NONE SEEN   WBC, UA TOO NUMEROUS TO COUNT 0 - 5 WBC/hpf   RBC / HPF 0-5 0 - 5 RBC/hpf   Bacteria, UA FEW (A) NONE SEEN  Wet prep, genital     Status: Abnormal   Collection Time: 12/05/15  4:44 PM  Result Value Ref Range   Yeast Wet Prep HPF POC NONE SEEN NONE SEEN   Trich, Wet Prep NONE SEEN NONE SEEN   Clue Cells Wet Prep HPF POC PRESENT (A) NONE SEEN   WBC, Wet Prep HPF POC TOO NUMEROUS TO COUNT (A) NONE SEEN   Sperm NONE SEEN     US Transvaginal Non-ob  12/05/2015  CLINICAL DATA:  Right-greater-than-left pelvic pain for 8 days. LMP 11/19/2015. EXAM: TRANSABDOMINAL  AND TRANSVAGINAL ULTRASOUND OF PELVIS TECHNIQUE: Both transabdominal and transvaginal ultrasound examinations of the pelvis were performed. Transabdominal technique was performed for global imaging of the pelvis including uterus, ovaries, adnexal regions, and pelvic cul-de-sac. It was necessary to proceed with endovaginal exam following the transabdominal exam to visualize the ovaries and endometrium to better advantage. COMPARISON:  Pelvic CT 05/17/2012. FINDINGS: Uterus Measurements: 8.9 x 4.1 x 5.6 cm. No fibroids or other mass visualized. Endometrium Thickness: 12.2 mm.  No focal abnormality visualized. Right ovary Measurements: 3.6 x 2.6 x 2.6 cm. There is a central hypoechoic area measuring 2 cm maximally, probably a collapsing follicle. Normal blood flow with color Doppler. Left ovary Measurements: 2.3 x 1.7 x 2.8 cm. There is a small echogenic area measuring 7 mm maximally without corresponding finding on prior CT. Other findings No abnormal free fluid. IMPRESSION: 1. No acute or worrisome findings. 2. Probable collapsing right ovarian follicle. Electronically Signed   By: Carey Bullocks M.D.   On: 12/05/2015 17:10   US Pelvis Complete  12/05/2015  CLINICAL DATA:  Right-greater-than-left pelvic pain for 8 days. LMP 11/19/2015. EXAM: TRANSABDOMINAL AND TRANSVAGINAL ULTRASOUND OF PELVIS TECHNIQUE: Both transabdominal and transvaginal ultrasound examinations of the pelvis were performed. Transabdominal technique was performed for global imaging of the pelvis including uterus, ovaries, adnexal regions, and pelvic cul-de-sac. It was necessary to proceed with endovaginal exam following the transabdominal exam to visualize the ovaries and endometrium to better advantage. COMPARISON:  Pelvic CT 05/17/2012. FINDINGS: Uterus Measurements: 8.9 x 4.1 x 5.6 cm. No fibroids or other mass visualized. Endometrium Thickness: 12.2 mm.  No focal abnormality visualized. Right ovary Measurements: 3.6 x 2.6 x 2.6 cm. There is  a central hypoechoic area measuring 2 cm maximally, probably a  collapsing follicle. Normal blood flow with color Doppler. Left ovary Measurements: 2.3 x 1.7 x 2.8 cm. There is a small echogenic area measuring 7 mm maximally without corresponding finding on prior CT. Other findings No abnormal free fluid. IMPRESSION: 1. No acute or worrisome findings. 2. Probable collapsing right ovarian follicle. Electronically Signed   By: Carey Bullocks M.D.   On: 12/05/2015 17:10     Kerrie Buffalo, NP has personally reviewed and evaluated these lab results as part of her medical decision-making.  MDM  30 y.o. female with lower abdominal pain x 1 week that has gotten worse stable for d/c without surgical abdomen, probably ruptured ovarian cyst and UTI. Will treat for pain with Toradol now and she will continue ibuprofen. Will treat for UTI and send urine for culture. Discussed with the patient and all questioned fully answered. She will return if any problems arise.   Final diagnoses:  Pelvic pain in female  UTI (lower urinary tract infection)   I personally performed the services described in this documentation, which was scribed in my presence. The recorded information has been reviewed and is accurate.    [facility] Orlene Och, NP 12/05/15 1751  Samuel Jester, DO 12/08/15 2340

## 2015-12-06 LAB — GC/CHLAMYDIA PROBE AMP (~~LOC~~) NOT AT ARMC
CHLAMYDIA, DNA PROBE: NEGATIVE
Neisseria Gonorrhea: NEGATIVE

## 2016-03-18 ENCOUNTER — Encounter (HOSPITAL_COMMUNITY): Payer: Self-pay

## 2016-03-18 ENCOUNTER — Emergency Department (HOSPITAL_COMMUNITY): Payer: Self-pay

## 2016-03-18 ENCOUNTER — Emergency Department (HOSPITAL_COMMUNITY)
Admission: EM | Admit: 2016-03-18 | Discharge: 2016-03-18 | Disposition: A | Discharge status: Home / Self Care | Payer: Self-pay | Attending: Emergency Medicine | Admitting: Emergency Medicine

## 2016-03-18 DIAGNOSIS — Z79899 Other long term (current) drug therapy: Secondary | ICD-10-CM | POA: Insufficient documentation

## 2016-03-18 DIAGNOSIS — Z87891 Personal history of nicotine dependence: Secondary | ICD-10-CM | POA: Insufficient documentation

## 2016-03-18 DIAGNOSIS — R51 Headache: Secondary | ICD-10-CM | POA: Insufficient documentation

## 2016-03-18 DIAGNOSIS — Z8659 Personal history of other mental and behavioral disorders: Secondary | ICD-10-CM | POA: Insufficient documentation

## 2016-03-18 DIAGNOSIS — Z862 Personal history of diseases of the blood and blood-forming organs and certain disorders involving the immune mechanism: Secondary | ICD-10-CM | POA: Insufficient documentation

## 2016-03-18 DIAGNOSIS — Z8679 Personal history of other diseases of the circulatory system: Secondary | ICD-10-CM | POA: Insufficient documentation

## 2016-03-18 DIAGNOSIS — R112 Nausea with vomiting, unspecified: Secondary | ICD-10-CM | POA: Insufficient documentation

## 2016-03-18 DIAGNOSIS — R42 Dizziness and giddiness: Secondary | ICD-10-CM | POA: Insufficient documentation

## 2016-03-18 DIAGNOSIS — H53149 Visual discomfort, unspecified: Secondary | ICD-10-CM | POA: Insufficient documentation

## 2016-03-18 DIAGNOSIS — L550 Sunburn of first degree: Secondary | ICD-10-CM | POA: Insufficient documentation

## 2016-03-18 DIAGNOSIS — R202 Paresthesia of skin: Secondary | ICD-10-CM | POA: Insufficient documentation

## 2016-03-18 DIAGNOSIS — R519 Headache, unspecified: Secondary | ICD-10-CM

## 2016-03-18 LAB — BASIC METABOLIC PANEL
Anion gap: 11 (ref 5–15)
BUN: 9 mg/dL (ref 6–20)
CALCIUM: 9.5 mg/dL (ref 8.9–10.3)
CHLORIDE: 111 mmol/L (ref 101–111)
CO2: 21 mmol/L — AB (ref 22–32)
CREATININE: 0.68 mg/dL (ref 0.44–1.00)
GFR calc non Af Amer: >60 mL/min (ref >60)
GLUCOSE: 94 mg/dL (ref 65–99)
Potassium: 3.7 mmol/L (ref 3.5–5.1)
Sodium: 143 mmol/L (ref 135–145)

## 2016-03-18 LAB — CBC
HEMATOCRIT: 36.6 % (ref 36.0–46.0)
HEMOGLOBIN: 11.5 g/dL — AB (ref 12.0–15.0)
MCH: 25.2 pg — AB (ref 26.0–34.0)
MCHC: 31.4 g/dL (ref 30.0–36.0)
MCV: 80.1 fL (ref 78.0–100.0)
Platelets: 332 10*3/uL (ref 150–400)
RBC: 4.57 MIL/uL (ref 3.87–5.11)
RDW: 15.3 % (ref 11.5–15.5)
WBC: 10.1 10*3/uL (ref 4.0–10.5)

## 2016-03-18 MED ORDER — ONDANSETRON HCL 4 MG/2ML IJ SOLN
4.0000 mg | Freq: Once | INTRAMUSCULAR | Status: AC
  Administered 2016-03-18: 4 mg via INTRAVENOUS
  Filled 2016-03-18: qty 2

## 2016-03-18 MED ORDER — METOCLOPRAMIDE HCL 5 MG/ML IJ SOLN
10.0000 mg | Freq: Once | INTRAMUSCULAR | Status: AC
  Administered 2016-03-18: 10 mg via INTRAVENOUS
  Filled 2016-03-18: qty 2

## 2016-03-18 MED ORDER — KETOROLAC TROMETHAMINE 30 MG/ML IJ SOLN
30.0000 mg | Freq: Once | INTRAMUSCULAR | Status: AC
  Administered 2016-03-18: 30 mg via INTRAVENOUS
  Filled 2016-03-18: qty 1

## 2016-03-18 MED ORDER — DIPHENHYDRAMINE HCL 50 MG/ML IJ SOLN
12.5000 mg | Freq: Once | INTRAMUSCULAR | Status: AC
Start: 2016-03-18 — End: 2016-03-18
  Administered 2016-03-18: 12.5 mg via INTRAVENOUS
  Filled 2016-03-18: qty 1

## 2016-03-18 MED ORDER — SODIUM CHLORIDE 0.9 % IV BOLUS (SEPSIS)
1000.0000 mL | Freq: Once | INTRAVENOUS | Status: AC
  Administered 2016-03-18: 1000 mL via INTRAVENOUS

## 2016-03-18 NOTE — Discharge Instructions (Signed)
Please read and follow all provided instructions.  Your diagnoses today include:  1. Acute nonintractable headache, unspecified headache type    Tests performed today include:  CT of your head which was normal and did not show any serious cause of your headache  Vital signs. See below for your results today.   Medications:  In the Emergency Department you received:  Reglan - antinausea/headache medication  Benadryl - antihistamine to counteract potential side effects of reglan  Toradol - NSAID medication similar to ibuprofen  Take any prescribed medications only as directed.  Additional information:  Follow any educational materials contained in this packet.  You are having a headache. No specific cause was found today for your headache. It may have been a migraine or other cause of headache. Stress, anxiety, fatigue, and depression are common triggers for headaches.   Your headache today does not appear to be life-threatening or require hospitalization, but often the exact cause of headaches is not determined in the emergency department. Therefore, follow-up with your doctor is very important to find out what may have caused your headache and whether or not you need any further diagnostic testing or treatment.   Sometimes headaches can appear benign (not harmful), but then more serious symptoms can develop which should prompt an immediate re-evaluation by your doctor or the emergency department.  BE VERY CAREFUL not to take multiple medicines containing Tylenol (also called acetaminophen). Doing so can lead to an overdose which can damage your liver and cause liver failure and possibly death.   Follow-up instructions: Please follow-up with your primary care provider in the next 3 days for further evaluation of your symptoms.   Return instructions:   Please return to the Emergency Department if you experience worsening symptoms.  Return if the medications do not resolve your  headache, if it recurs, or if you have multiple episodes of vomiting or cannot keep down fluids.  Return if you have a change from the usual headache.  RETURN IMMEDIATELY IF you:  Develop a sudden, severe headache  Develop confusion or become poorly responsive or faint  Develop a fever above 100.66F or problem breathing  Have a change in speech, vision, swallowing, or understanding  Develop new weakness, numbness, tingling, incoordination in your arms or legs  Have a seizure  Please return if you have any other emergent concerns.  Additional Information:  Your vital signs today were: BP 124/74 mmHg   Pulse 62   Temp(Src) 98.1 F (36.7 C) (Oral)   Resp 14   Ht 5\' 1"  (1.549 m)   Wt 77.111 kg   BMI 32.14 kg/m2   SpO2 100% If your blood pressure (BP) was elevated above 135/85 this visit, please have this repeated by your doctor within one month. --------------

## 2016-03-18 NOTE — ED Notes (Signed)
Small palpable knot to the left upper neck in the hair line.  Patient has the headache in her forehead with light sensitivity.  Patients right eye has twitching.  Patient is weaker to sensation in the right foot only with normal sensation in the arms and face.

## 2016-03-18 NOTE — ED Provider Notes (Signed)
History  By signing my name below, I, Karle Plumber, attest that this documentation has been prepared under the direction and in the presence of Audry Pili, PA-C. Electronically Signed: Karle Plumber, ED Scribe. 03/18/2016. 6:38 PM.  Chief Complaint  Patient presents with  . Headache  . Nausea   The history is provided by the patient and medical records. No language interpreter was used.    HPI Comments:  Lori Roy is a 30 y.o. female with PMHx of migraines who presents to the Emergency Department complaining of a migraine that began four days ago. She reports the pain is above and behind her eyes and radiates into her neck. Pt reports associated nausea with three episodes of emesis since onset of symptoms. She reports a "bump" to the posterior neck that is painful to touch. She reports tingling in bilateral hands and feet. She reports photophobia and dizziness. Pt rates her pain at 9/10. She has been taking Tylenol with temporary, minimal relief of the pain, last dose about 6 hours ago. Light and moving her head increases her pain. She denies alleviating factors. She denies fever, chills. Pt is ambulatory without assistance.  Past Medical History  Diagnosis Date  . Normal pregnancy 07/12/2011  . SROM (spontaneous rupture of membranes) 07/12/2011  . SVD (spontaneous vaginal delivery) 07/13/2011  . Iron deficiency anemia   . Migraines   . Anxiety    Past Surgical History  Procedure Laterality Date  . Wisdom tooth extraction  ~ 2006  . Cholecystectomy N/A 05/11/2013    Procedure: LAPAROSCOPIC CHOLECYSTECTOMY WITH INTRAOPERATIVE CHOLANGIOGRAM;  Surgeon: Cherylynn Ridges, MD;  Location: [facility] OR;  Service: General;  Laterality: N/A;   No family history on file. Social History  Substance Use Topics  . Smoking status: Former Smoker -- 0.10 packs/day for 3 years    Types: Cigarettes    Quit date: 11/17/2010  . Smokeless tobacco: Never Used  . Alcohol Use: No   OB History     Gravida Para Term Preterm AB TAB SAB Ectopic Multiple Living   2 2 2  0 0 0 0 0 0 2     Review of Systems A complete 10 system review of systems was obtained and all systems are negative except as noted in the HPI and PMH.   Allergies  Review of patient's allergies indicates no known allergies.  Home Medications   Prior to Admission medications   Medication Sig Start Date End Date Taking? Authorizing Provider  ibuprofen (ADVIL,MOTRIN) 600 MG tablet Take 1 tablet (600 mg total) by mouth every 6 (six) hours. 08/02/14   Huel Cote, MD  ondansetron (ZOFRAN) 4 MG tablet Take 1 tablet (4 mg total) by mouth every 6 (six) hours. 12/05/15   Hope Orlene Och, NP  oxyCODONE-acetaminophen (PERCOCET/ROXICET) 5-325 MG per tablet Take 1 tablet by mouth every 4 (four) hours as needed (for pain scale less than 7). 08/02/14   Huel Cote, MD  phenazopyridine (PYRIDIUM) 200 MG tablet Take 1 tablet (200 mg total) by mouth 3 (three) times daily. 12/05/15   Hope Orlene Och, NP  Prenatal Vit-Fe Fumarate-FA (PRENATAL MULTIVITAMIN) TABS tablet Take 1 tablet by mouth daily at 12 noon.    Historical Provider, MD   Triage Vitals: BP 153/100 mmHg  Pulse 88  Temp(Src) 98.1 F (36.7 C) (Oral)  Resp 18  Ht 5\' 1"  (1.549 m)  Wt 170 lb (77.111 kg)  BMI 32.14 kg/m2  SpO2 98% Physical Exam  Constitutional: She is oriented to person,  place, and time. She appears well-developed and well-nourished.  HENT:  Head: Normocephalic and atraumatic.  Eyes: EOM are normal. Pupils are equal, round, and reactive to light.  Neck: Trachea normal and normal range of motion. Neck supple. No spinous process tenderness and no muscular tenderness present. No rigidity. No tracheal deviation, no erythema and normal range of motion present.  Pain out of proportion to exam. Pt tender with ROM of neck with majority of discomfort felt over left posterior scalp. Pustule noted on that area. Non erythematous. Non infectious. No induration. TTP.  Blanchable. 1st degree sunburn noted across trapezius and into posterior neck    Cardiovascular: Normal rate, regular rhythm, normal heart sounds and intact distal pulses.   No murmur heard. Pulmonary/Chest: Effort normal and breath sounds normal. No respiratory distress. She has no wheezes. She has no rales. She exhibits no tenderness.  Abdominal: Soft. There is no tenderness.  Musculoskeletal: Normal range of motion.  Neurological: She is alert and oriented to person, place, and time. She has normal strength. No cranial nerve deficit or sensory deficit.  Pt able to ambulate. Neg Pronator Drift Cranial Nerves:  II: Pupils equal, round, reactive to light III,IV, VI: ptosis not present, extra-ocular motions intact bilaterally  V,VII: smile symmetric, facial light touch sensation equal VIII: hearing grossly normal bilaterally  IX,X: midline uvula rise  XI: bilateral shoulder shrug equal and strong XII: midline tongue extension  Skin: Skin is warm and dry.  Psychiatric: She has a normal mood and affect. Her behavior is normal. Thought content normal.  Nursing note and vitals reviewed.  ED Course  Procedures (including critical care time) DIAGNOSTIC STUDIES: Oxygen Saturation is 98% on RA, normal by my interpretation.   COORDINATION OF CARE: 5:10 PM- Will order CT scan of head and IV fluids. Pt verbalizes understanding and agrees to plan.  Medications  ondansetron (ZOFRAN) injection 4 mg (not administered)  sodium chloride 0.9 % bolus 1,000 mL (1,000 mLs Intravenous New Bag/Given 03/18/16 1800)   Labs Review Labs Reviewed  CBC - Abnormal; Notable for the following:    Hemoglobin 11.5 (*)    MCH 25.2 (*)    All other components within normal limits  BASIC METABOLIC PANEL - Abnormal; Notable for the following:    CO2 21 (*)    All other components within normal limits   Imaging Review Ct Head Wo Contrast  03/18/2016  CLINICAL DATA:  Headache behind eyes. EXAM: CT HEAD WITHOUT  CONTRAST TECHNIQUE: Contiguous axial images were obtained from the base of the skull through the vertex without intravenous contrast. COMPARISON:  None. FINDINGS: There is no evidence of mass effect, midline shift or extra-axial fluid collections. There is no evidence of a space-occupying lesion or intracranial hemorrhage. There is no evidence of a cortical-based area of acute infarction. The ventricles and sulci are appropriate for the patient's age. The basal cisterns are patent. Visualized portions of the orbits are unremarkable. The visualized portions of the paranasal sinuses and mastoid air cells are unremarkable. The osseous structures are unremarkable. IMPRESSION: Normal CT of the brain without intravenous contrast. Electronically Signed   By: Elige Ko   On: 03/18/2016 19:23   I have personally reviewed and evaluated these images and lab results as part of my medical decision-making.   EKG Interpretation None      MDM  I have reviewed and evaluated the relevant laboratory values I have reviewed and evaluated the relevant imaging studies.  I have reviewed the relevant previous healthcare  records. I obtained HPI from historian. Patient discussed with supervising physician  ED Course:  Assessment: Patient is a 30yF that presents with headache x 4days. Patient is without high-risk features of headache including: Sudden onset/thunderclap HA, No similar headache in past, Altered mental status, Accompanying seizure, Headache with exertion, Age > 50, History of immunocompromise, Neck or shoulder pain, Fever, Use of anticoagulation, Family history of spontaneous SAH, Concomitant drug use, Toxic exposure.  Patient has a normal complete neurological exam, normal vital signs, normal level of consciousness, no signs of meningismus, is well-appearing/non-toxic appearing, no signs of trauma. No papilledema, no pain over the temporal arteries. CT head unremarkable. Labs unremarkable. No dangerous or  life-threatening conditions suspected or identified by history, physical exam, and by work-up. No indications for hospitalization identified. Given Toradol, benadryl, Reglan with vast improvement of symptoms. Neck pain likely related to 1st degree sunburn on posterior neck as she had superficial tenderness. Pt tolerating PO well. At time of discharge, Patient is in no acute distress. Vital Signs are stable. Patient is able to ambulate. Patient able to tolerate PO.    Disposition/Plan:  DC Home Additional Verbal discharge instructions given and discussed with patient.  Pt Instructed to f/u with PCP in the next week for evaluation and treatment of symptoms. Return precautions given Pt acknowledges and agrees with plan  Supervising Physician Arby BarretteMarcy Pfeiffer, MD  Final diagnoses:  Acute nonintractable headache, unspecified headache type    I personally performed the services described in this documentation, which was scribed in my presence. The recorded information has been reviewed and is accurate.   Audry Piliyler Nailani Full, PA-C 03/18/16 [id number]  Arby BarretteMarcy Pfeiffer, MD 03/18/16 [phone]

## 2016-03-18 NOTE — ED Notes (Signed)
Patient here with onset of migraine since Friday evening. Reports pain to bilateral eyes with blurred vision. Arm numbness, neck stiffness, alert and oriented, no neuro deficits, steady gait ambulatory in and out of triage

## 2016-09-12 LAB — OB RESULTS CONSOLE RPR: RPR: NONREACTIVE

## 2016-09-12 LAB — OB RESULTS CONSOLE RUBELLA ANTIBODY, IGM: RUBELLA: IMMUNE

## 2016-09-12 LAB — OB RESULTS CONSOLE GC/CHLAMYDIA
CHLAMYDIA, DNA PROBE: NEGATIVE
Gonorrhea: NEGATIVE

## 2016-09-12 LAB — OB RESULTS CONSOLE HEPATITIS B SURFACE ANTIGEN: HEP B S AG: NEGATIVE

## 2017-01-06 LAB — OB RESULTS CONSOLE HIV ANTIBODY (ROUTINE TESTING): HIV: NONREACTIVE

## 2017-03-05 LAB — OB RESULTS CONSOLE GBS: GBS: NEGATIVE

## 2017-03-26 ENCOUNTER — Inpatient Hospital Stay (HOSPITAL_COMMUNITY)
Admission: AD | Admit: 2017-03-26 | Discharge: 2017-03-29 | DRG: 766 | Disposition: A | Discharge status: Home / Self Care | Payer: Medicaid Other | Source: Ambulatory Visit | Attending: Obstetrics and Gynecology | Admitting: Obstetrics and Gynecology

## 2017-03-26 ENCOUNTER — Encounter (HOSPITAL_COMMUNITY): Payer: Self-pay | Admitting: *Deleted

## 2017-03-26 DIAGNOSIS — Z3493 Encounter for supervision of normal pregnancy, unspecified, third trimester: Secondary | ICD-10-CM | POA: Diagnosis present

## 2017-03-26 DIAGNOSIS — Z302 Encounter for sterilization: Secondary | ICD-10-CM | POA: Diagnosis not present

## 2017-03-26 DIAGNOSIS — Z3A38 38 weeks gestation of pregnancy: Secondary | ICD-10-CM

## 2017-03-26 DIAGNOSIS — Z87891 Personal history of nicotine dependence: Secondary | ICD-10-CM | POA: Diagnosis not present

## 2017-03-26 DIAGNOSIS — O4290 Premature rupture of membranes, unspecified as to length of time between rupture and onset of labor, unspecified weeks of gestation: Secondary | ICD-10-CM | POA: Diagnosis present

## 2017-03-26 DIAGNOSIS — O99344 Other mental disorders complicating childbirth: Secondary | ICD-10-CM | POA: Diagnosis present

## 2017-03-26 DIAGNOSIS — O4202 Full-term premature rupture of membranes, onset of labor within 24 hours of rupture: Secondary | ICD-10-CM | POA: Diagnosis present

## 2017-03-26 DIAGNOSIS — F329 Major depressive disorder, single episode, unspecified: Secondary | ICD-10-CM | POA: Diagnosis present

## 2017-03-26 DIAGNOSIS — F419 Anxiety disorder, unspecified: Secondary | ICD-10-CM | POA: Diagnosis present

## 2017-03-26 HISTORY — DX: Depression, unspecified: F32.A

## 2017-03-26 HISTORY — DX: Major depressive disorder, single episode, unspecified: F32.9

## 2017-03-26 LAB — TYPE AND SCREEN
ABO/RH(D): A POS
ANTIBODY SCREEN: NEGATIVE

## 2017-03-26 LAB — CBC
HCT: 30.7 % — ABNORMAL LOW (ref 36.0–46.0)
Hemoglobin: 10.3 g/dL — ABNORMAL LOW (ref 12.0–15.0)
MCH: 28.4 pg (ref 26.0–34.0)
MCHC: 33.6 g/dL (ref 30.0–36.0)
MCV: 84.6 fL (ref 78.0–100.0)
PLATELETS: 286 10*3/uL (ref 150–400)
RBC: 3.63 MIL/uL — ABNORMAL LOW (ref 3.87–5.11)
RDW: 13.8 % (ref 11.5–15.5)
WBC: 11 10*3/uL — AB (ref 4.0–10.5)

## 2017-03-26 MED ORDER — TERBUTALINE SULFATE 1 MG/ML IJ SOLN
0.2500 mg | Freq: Once | INTRAMUSCULAR | Status: AC | PRN
  Administered 2017-03-27: 0.25 mg via SUBCUTANEOUS
  Filled 2017-03-26: qty 1

## 2017-03-26 MED ORDER — OXYTOCIN 40 UNITS IN LACTATED RINGERS INFUSION - SIMPLE MED: 2.5000 [IU]/h | INTRAVENOUS | Status: DC

## 2017-03-26 MED ORDER — ONDANSETRON HCL 4 MG/2ML IJ SOLN: 4.0000 mg | Freq: Four times a day (QID) | INTRAMUSCULAR | Status: DC | PRN

## 2017-03-26 MED ORDER — LACTATED RINGERS IV SOLN: 500.0000 mL | INTRAVENOUS | Status: DC | PRN

## 2017-03-26 MED ORDER — BUTORPHANOL TARTRATE 1 MG/ML IJ SOLN
1.0000 mg | INTRAMUSCULAR | Status: DC | PRN
  Administered 2017-03-26 – 2017-03-27 (×3): 1 mg via INTRAVENOUS
  Filled 2017-03-26 (×3): qty 1

## 2017-03-26 MED ORDER — OXYTOCIN BOLUS FROM INFUSION: 500.0000 mL | Freq: Once | INTRAVENOUS | Status: DC

## 2017-03-26 MED ORDER — OXYCODONE-ACETAMINOPHEN 5-325 MG PO TABS: 1.0000 | ORAL_TABLET | ORAL | Status: DC | PRN

## 2017-03-26 MED ORDER — SOD CITRATE-CITRIC ACID 500-334 MG/5ML PO SOLN
30.0000 mL | ORAL | Status: DC | PRN
  Administered 2017-03-27: 30 mL via ORAL
  Filled 2017-03-26: qty 15

## 2017-03-26 MED ORDER — ACETAMINOPHEN 325 MG PO TABS
650.0000 mg | ORAL_TABLET | ORAL | Status: DC | PRN
  Administered 2017-03-26 – 2017-03-27 (×2): 650 mg via ORAL
  Filled 2017-03-26 (×2): qty 2

## 2017-03-26 MED ORDER — OXYCODONE-ACETAMINOPHEN 5-325 MG PO TABS: 2.0000 | ORAL_TABLET | ORAL | Status: DC | PRN

## 2017-03-26 MED ORDER — LACTATED RINGERS IV SOLN
INTRAVENOUS | Status: DC
  Administered 2017-03-26 – 2017-03-27 (×4): via INTRAVENOUS

## 2017-03-26 MED ORDER — LIDOCAINE HCL (PF) 1 % IJ SOLN: 30.0000 mL | INTRAMUSCULAR | Status: DC | PRN

## 2017-03-26 MED ORDER — OXYTOCIN 40 UNITS IN LACTATED RINGERS INFUSION - SIMPLE MED
1.0000 m[IU]/min | INTRAVENOUS | Status: DC
  Administered 2017-03-26 – 2017-03-27 (×2): 2 m[IU]/min via INTRAVENOUS
  Filled 2017-03-26: qty 1000

## 2017-03-26 NOTE — Progress Notes (Signed)
Patient ID: Lori Roy, female   DOB: 1986/05/19, 31 y.o.   MRN: [id number] Pt appreciating mild contractions VSS EFM - 125, intermittent variables, moderate variability TOCO - ctxs q 1-514mins SVE - 1/90/-2  A/P: Latent labor, SROM x 11hrs          Pitocin stopped at this time; restart once FHTs stable ( was at 6mus)

## 2017-03-26 NOTE — Progress Notes (Signed)
Patient ID: Garner GavelKristin D Aultman, female   DOB: November 02, 1986, 31 y.o.   MRN: [id number] Pt reports back pain and fatigue. Has been sobbing intermittently. States "i'm scared" but she is unable to further explain what is causing her to feel that way. Pt concedes due to fatigue VSS CAT 1, 150 Ctxs not detected Cervix 1/65/-36 ( thicker and higher than before)  P: Term pregnancy, SROM , latent labor    Offered stadol for pain relief     Pitocin for iol to be restarted once pain better controlled as nitrous not helping    Continue with expectant mgmt     Pt agrees to plan

## 2017-03-26 NOTE — Anesthesia Pain Management Evaluation Note (Signed)
  [facility]NA Pain Management Visit Note  Patient: Garner GavelKristin D Tory, [age] y.o., female  "Hello I am a member of the anesthesia team at [facility]. We have an anesthesia team available at all times to provide care throughout the hospital, including epidural management and anesthesia for C-section. I don't know your plan for the delivery whether it a natural birth, water birth, IV sedation, nitrous supplementation, doula or epidural, but we want to meet your pain goals."   1.Was your pain managed to your expectations on prior hospitalizations?   Yes   2.What is your expectation for pain management during this hospitalization?     Epidural  3.How can we help you reach that goal? epidural  Record the patient's initial score and the patient's pain goal.   Pain: 1  Pain Goal: 10 [facility] wants you to be able to say your pain was always managed very well.  Amamda Curbow 03/26/2017

## 2017-03-26 NOTE — H&P (Signed)
Garner GavelKristin D Nicastro is a 31 y.o. female presenting for direct admission due to SROM. Pt reports gush of fluid that persisted this morning. On exam in office confirmed SROM with clear fluid. No regular contractions.  Pt is dated per LMP and confirmed by 10week US. Hx migraines prior to pregnancy; managed with fioricet in pregnancy. Neg preE.  She declined carrier screening; first trimester screen neg. Restarted on zoloft due to anxiety/depression at [redacted]weeks gestation. She developed a rash on LE at [redacted]weeks gestation - neg cholestasis workup; treated with hydroxyzine. GBS neg.   OB History    Gravida Para Term Preterm AB Living   2 2 2  0 0 2   SAB TAB Ectopic Multiple Live Births   0 0 0 0 2     Past Medical History:  Diagnosis Date  . Anxiety   . Iron deficiency anemia   . Migraines   . Normal pregnancy 07/12/2011  . SROM (spontaneous rupture of membranes) 07/12/2011  . SVD (spontaneous vaginal delivery) 07/13/2011   Past Surgical History:  Procedure Laterality Date  . CHOLECYSTECTOMY N/A 05/11/2013   Procedure: LAPAROSCOPIC CHOLECYSTECTOMY WITH INTRAOPERATIVE CHOLANGIOGRAM;  Surgeon: Cherylynn RidgesJames O Wyatt, MD;  Location: [facility] OR;  Service: General;  Laterality: N/A;  . WISDOM TOOTH EXTRACTION  ~ 2006   Family History: family history is not on file. Social History:  reports that she quit smoking about 6 years ago. Her smoking use included Cigarettes. She has a 0.30 pack-year smoking history. She has never used smokeless tobacco. She reports that she uses drugs, including Marijuana. She reports that she does not drink alcohol.     Maternal Diabetes: No Genetic Screening: Normal Maternal Ultrasounds/Referrals: Normal Fetal Ultrasounds or other Referrals:  None Maternal Substance Abuse:  No Significant Maternal Medications:  Meds include: Other: fioricet, hydroxyzine Significant Maternal Lab Results:  Lab values include: Group B Strep negative Other Comments:  None  ROS History   unknown if  currently breastfeeding. Exam Physical Exam  Prenatal labs: ABO, Rh:   Antibody:   Rubella:   RPR:    HBsAg:    HIV:    GBS:     Assessment/Plan: G3P2002 at 3638 [redacted]wks gestation with SROM - clear fluid ; stable Pitocin for induction of labor Pain control prn pt request Anticipate svd GBS neg   Janean SarkCecilia W Kasee Hantz 03/26/2017, 1:21 PM

## 2017-03-26 NOTE — Progress Notes (Signed)
Patient ID: Garner GavelKristin D Goodine, female   DOB: [birth year], 31 y.o.   MRN: [id number] Pt doing well with no complaints Tolerating pitocin  CAT 1 tracing Ctxs q 2-714mins Plan : Continue with labor induction            Check cervix prn or in 2-3hrs            Anticipate svd

## 2017-03-27 ENCOUNTER — Encounter (HOSPITAL_COMMUNITY): Payer: Self-pay | Admitting: Obstetrics and Gynecology

## 2017-03-27 ENCOUNTER — Inpatient Hospital Stay (HOSPITAL_COMMUNITY): Payer: Medicaid Other | Admitting: Anesthesiology

## 2017-03-27 ENCOUNTER — Encounter (HOSPITAL_COMMUNITY)
Admission: AD | Disposition: A | Discharge status: Home / Self Care | Payer: Self-pay | Source: Ambulatory Visit | Attending: Obstetrics and Gynecology

## 2017-03-27 LAB — RPR: RPR: NONREACTIVE

## 2017-03-27 SURGERY — Surgical Case
Anesthesia: Epidural | Wound class: Clean Contaminated

## 2017-03-27 MED ORDER — METOCLOPRAMIDE HCL 5 MG/ML IJ SOLN
INTRAMUSCULAR | Status: DC | PRN
  Administered 2017-03-27: 10 mg via INTRAVENOUS

## 2017-03-27 MED ORDER — SIMETHICONE 80 MG PO CHEW
80.0000 mg | CHEWABLE_TABLET | ORAL | Status: DC
  Administered 2017-03-28 – 2017-03-29 (×2): 80 mg via ORAL
  Filled 2017-03-27 (×2): qty 1

## 2017-03-27 MED ORDER — DIPHENHYDRAMINE HCL 50 MG/ML IJ SOLN: 12.5000 mg | INTRAMUSCULAR | Status: DC | PRN

## 2017-03-27 MED ORDER — FENTANYL 2.5 MCG/ML BUPIVACAINE 1/10 % EPIDURAL INFUSION (WH - ANES)
INTRAMUSCULAR | Status: AC
  Filled 2017-03-27: qty 100

## 2017-03-27 MED ORDER — COCONUT OIL OIL: 1.0000 "application " | TOPICAL_OIL | Status: DC | PRN

## 2017-03-27 MED ORDER — FENTANYL CITRATE (PF) 100 MCG/2ML IJ SOLN
INTRAMUSCULAR | Status: AC
  Filled 2017-03-27: qty 2

## 2017-03-27 MED ORDER — NALOXONE HCL 0.4 MG/ML IJ SOLN: 0.4000 mg | INTRAMUSCULAR | Status: DC | PRN

## 2017-03-27 MED ORDER — LACTATED RINGERS IV SOLN
INTRAVENOUS | Status: DC | PRN
  Administered 2017-03-27: 12:00:00 via INTRAVENOUS

## 2017-03-27 MED ORDER — SCOPOLAMINE 1 MG/3DAYS TD PT72
MEDICATED_PATCH | TRANSDERMAL | Status: AC
  Filled 2017-03-27: qty 1

## 2017-03-27 MED ORDER — MEPERIDINE HCL 25 MG/ML IJ SOLN: 6.2500 mg | INTRAMUSCULAR | Status: DC | PRN

## 2017-03-27 MED ORDER — ONDANSETRON HCL 4 MG/2ML IJ SOLN
INTRAMUSCULAR | Status: DC | PRN
  Administered 2017-03-27: 4 mg via INTRAVENOUS

## 2017-03-27 MED ORDER — NALBUPHINE HCL 10 MG/ML IJ SOLN: 5.0000 mg | Freq: Once | INTRAMUSCULAR | Status: DC | PRN

## 2017-03-27 MED ORDER — OXYTOCIN 40 UNITS IN LACTATED RINGERS INFUSION - SIMPLE MED: 2.5000 [IU]/h | INTRAVENOUS | Status: AC

## 2017-03-27 MED ORDER — TETANUS-DIPHTH-ACELL PERTUSSIS 5-2.5-18.5 LF-MCG/0.5 IM SUSP: 0.5000 mL | Freq: Once | INTRAMUSCULAR | Status: DC

## 2017-03-27 MED ORDER — PHENYLEPHRINE 40 MCG/ML (10ML) SYRINGE FOR IV PUSH (FOR BLOOD PRESSURE SUPPORT)
PREFILLED_SYRINGE | INTRAVENOUS | Status: AC
  Filled 2017-03-27: qty 20

## 2017-03-27 MED ORDER — SODIUM CHLORIDE 0.9 % IV SOLN
1.0000 g | Freq: Four times a day (QID) | INTRAVENOUS | Status: DC
  Administered 2017-03-27 (×2): 1 g via INTRAVENOUS
  Filled 2017-03-27 (×5): qty 1000

## 2017-03-27 MED ORDER — SODIUM BICARBONATE 8.4 % IV SOLN
INTRAVENOUS | Status: DC | PRN
  Administered 2017-03-27 (×2): 5 mL via EPIDURAL

## 2017-03-27 MED ORDER — PHENYLEPHRINE 40 MCG/ML (10ML) SYRINGE FOR IV PUSH (FOR BLOOD PRESSURE SUPPORT)
80.0000 ug | PREFILLED_SYRINGE | INTRAVENOUS | Status: AC | PRN
  Administered 2017-03-27 (×3): 80 ug via INTRAVENOUS

## 2017-03-27 MED ORDER — OXYCODONE HCL 5 MG PO TABS
10.0000 mg | ORAL_TABLET | ORAL | Status: DC | PRN
  Administered 2017-03-28 – 2017-03-29 (×4): 10 mg via ORAL
  Filled 2017-03-27 (×4): qty 2

## 2017-03-27 MED ORDER — DIBUCAINE 1 % RE OINT: 1.0000 "application " | TOPICAL_OINTMENT | RECTAL | Status: DC | PRN

## 2017-03-27 MED ORDER — IBUPROFEN 800 MG PO TABS
800.0000 mg | ORAL_TABLET | Freq: Three times a day (TID) | ORAL | Status: DC
  Administered 2017-03-27 – 2017-03-29 (×5): 800 mg via ORAL
  Filled 2017-03-27 (×5): qty 1

## 2017-03-27 MED ORDER — LIDOCAINE HCL (PF) 1 % IJ SOLN
INTRAMUSCULAR | Status: DC | PRN
  Administered 2017-03-27: 6 mL via EPIDURAL
  Administered 2017-03-27: 4 mL

## 2017-03-27 MED ORDER — KETOROLAC TROMETHAMINE 30 MG/ML IJ SOLN
30.0000 mg | Freq: Four times a day (QID) | INTRAMUSCULAR | Status: AC | PRN
  Administered 2017-03-27: 30 mg via INTRAMUSCULAR

## 2017-03-27 MED ORDER — ONDANSETRON HCL 4 MG/2ML IJ SOLN
INTRAMUSCULAR | Status: AC
  Filled 2017-03-27: qty 2

## 2017-03-27 MED ORDER — DEXTROSE 5 % IV SOLN
1.5000 mg/kg | Freq: Three times a day (TID) | INTRAVENOUS | Status: DC
  Administered 2017-03-27 (×2): 130 mg via INTRAVENOUS
  Filled 2017-03-27 (×5): qty 3.25

## 2017-03-27 MED ORDER — LACTATED RINGERS IV SOLN
INTRAVENOUS | Status: DC | PRN
  Administered 2017-03-27 (×2): via INTRAVENOUS

## 2017-03-27 MED ORDER — SENNOSIDES-DOCUSATE SODIUM 8.6-50 MG PO TABS
2.0000 | ORAL_TABLET | ORAL | Status: DC
  Administered 2017-03-28 – 2017-03-29 (×2): 2 via ORAL
  Filled 2017-03-27 (×2): qty 2

## 2017-03-27 MED ORDER — LACTATED RINGERS IV SOLN
INTRAVENOUS | Status: DC | PRN
  Administered 2017-03-27: 40 [IU] via INTRAVENOUS

## 2017-03-27 MED ORDER — PHENYLEPHRINE 40 MCG/ML (10ML) SYRINGE FOR IV PUSH (FOR BLOOD PRESSURE SUPPORT)
80.0000 ug | PREFILLED_SYRINGE | INTRAVENOUS | Status: DC | PRN
  Administered 2017-03-27: 80 ug via INTRAVENOUS

## 2017-03-27 MED ORDER — SIMETHICONE 80 MG PO CHEW
80.0000 mg | CHEWABLE_TABLET | Freq: Three times a day (TID) | ORAL | Status: DC
  Administered 2017-03-27 – 2017-03-29 (×5): 80 mg via ORAL
  Filled 2017-03-27 (×5): qty 1

## 2017-03-27 MED ORDER — PHENYLEPHRINE HCL 10 MG/ML IJ SOLN
INTRAMUSCULAR | Status: DC | PRN
  Administered 2017-03-27: 80 ug via INTRAVENOUS

## 2017-03-27 MED ORDER — MORPHINE SULFATE (PF) 0.5 MG/ML IJ SOLN
INTRAMUSCULAR | Status: AC
  Filled 2017-03-27: qty 10

## 2017-03-27 MED ORDER — LACTATED RINGERS IV SOLN: 500.0000 mL | Freq: Once | INTRAVENOUS | Status: DC

## 2017-03-27 MED ORDER — KETOROLAC TROMETHAMINE 30 MG/ML IJ SOLN
INTRAMUSCULAR | Status: AC
  Filled 2017-03-27: qty 1

## 2017-03-27 MED ORDER — EPHEDRINE 5 MG/ML INJ
INTRAVENOUS | Status: AC
  Filled 2017-03-27: qty 10

## 2017-03-27 MED ORDER — FENTANYL CITRATE (PF) 100 MCG/2ML IJ SOLN
25.0000 ug | INTRAMUSCULAR | Status: DC | PRN
  Administered 2017-03-27 (×2): 50 ug via INTRAVENOUS

## 2017-03-27 MED ORDER — DEXAMETHASONE SODIUM PHOSPHATE 4 MG/ML IJ SOLN
INTRAMUSCULAR | Status: DC | PRN
  Administered 2017-03-27: 4 mg via INTRAVENOUS

## 2017-03-27 MED ORDER — SODIUM CHLORIDE 0.9 % IR SOLN
Status: DC | PRN
  Administered 2017-03-27: 1

## 2017-03-27 MED ORDER — MORPHINE SULFATE (PF) 0.5 MG/ML IJ SOLN
INTRAMUSCULAR | Status: DC | PRN
  Administered 2017-03-27: 4 mg via EPIDURAL

## 2017-03-27 MED ORDER — LACTATED RINGERS IV SOLN
INTRAVENOUS | Status: DC
  Administered 2017-03-27 (×2): via INTRAUTERINE

## 2017-03-27 MED ORDER — DEXAMETHASONE SODIUM PHOSPHATE 10 MG/ML IJ SOLN
INTRAMUSCULAR | Status: AC
  Filled 2017-03-27: qty 1

## 2017-03-27 MED ORDER — MENTHOL 3 MG MT LOZG: 1.0000 | LOZENGE | OROMUCOSAL | Status: DC | PRN

## 2017-03-27 MED ORDER — NALBUPHINE HCL 10 MG/ML IJ SOLN: 5.0000 mg | INTRAMUSCULAR | Status: DC | PRN

## 2017-03-27 MED ORDER — ONDANSETRON HCL 4 MG/2ML IJ SOLN: 4.0000 mg | Freq: Three times a day (TID) | INTRAMUSCULAR | Status: DC | PRN

## 2017-03-27 MED ORDER — ACETAMINOPHEN 500 MG PO TABS
1000.0000 mg | ORAL_TABLET | Freq: Four times a day (QID) | ORAL | Status: AC
  Administered 2017-03-27 – 2017-03-28 (×4): 1000 mg via ORAL
  Filled 2017-03-27 (×4): qty 2

## 2017-03-27 MED ORDER — NALOXONE HCL 2 MG/2ML IJ SOSY
1.0000 ug/kg/h | PREFILLED_SYRINGE | INTRAMUSCULAR | Status: DC | PRN
  Filled 2017-03-27: qty 2

## 2017-03-27 MED ORDER — DIPHENHYDRAMINE HCL 25 MG PO CAPS: 25.0000 mg | ORAL_CAPSULE | Freq: Four times a day (QID) | ORAL | Status: DC | PRN

## 2017-03-27 MED ORDER — ACETAMINOPHEN 325 MG PO TABS
650.0000 mg | ORAL_TABLET | ORAL | Status: DC | PRN
  Administered 2017-03-29: 650 mg via ORAL
  Filled 2017-03-27: qty 2

## 2017-03-27 MED ORDER — KETOROLAC TROMETHAMINE 30 MG/ML IJ SOLN: 30.0000 mg | Freq: Four times a day (QID) | INTRAMUSCULAR | Status: AC | PRN

## 2017-03-27 MED ORDER — SODIUM CHLORIDE 0.9% FLUSH: 3.0000 mL | INTRAVENOUS | Status: DC | PRN

## 2017-03-27 MED ORDER — PROMETHAZINE HCL 25 MG/ML IJ SOLN: 6.2500 mg | INTRAMUSCULAR | Status: DC | PRN

## 2017-03-27 MED ORDER — OXYCODONE HCL 5 MG PO TABS
5.0000 mg | ORAL_TABLET | ORAL | Status: DC | PRN
  Administered 2017-03-27 – 2017-03-28 (×3): 5 mg via ORAL
  Filled 2017-03-27 (×3): qty 1

## 2017-03-27 MED ORDER — OXYTOCIN 10 UNIT/ML IJ SOLN
INTRAMUSCULAR | Status: AC
  Filled 2017-03-27: qty 4

## 2017-03-27 MED ORDER — LACTATED RINGERS IV SOLN
INTRAVENOUS | Status: DC
  Administered 2017-03-28: 01:00:00 via INTRAVENOUS

## 2017-03-27 MED ORDER — WITCH HAZEL-GLYCERIN EX PADS: 1.0000 "application " | MEDICATED_PAD | CUTANEOUS | Status: DC | PRN

## 2017-03-27 MED ORDER — DIPHENHYDRAMINE HCL 25 MG PO CAPS: 25.0000 mg | ORAL_CAPSULE | ORAL | Status: DC | PRN

## 2017-03-27 MED ORDER — FENTANYL 2.5 MCG/ML BUPIVACAINE 1/10 % EPIDURAL INFUSION (WH - ANES)
14.0000 mL/h | INTRAMUSCULAR | Status: DC | PRN
  Administered 2017-03-27: 14 mL/h via EPIDURAL

## 2017-03-27 MED ORDER — SIMETHICONE 80 MG PO CHEW: 80.0000 mg | CHEWABLE_TABLET | ORAL | Status: DC | PRN

## 2017-03-27 MED ORDER — NALBUPHINE HCL 10 MG/ML IJ SOLN
5.0000 mg | INTRAMUSCULAR | Status: DC | PRN
  Administered 2017-03-27: 5 mg via INTRAVENOUS
  Filled 2017-03-27: qty 1

## 2017-03-27 MED ORDER — METOCLOPRAMIDE HCL 5 MG/ML IJ SOLN
INTRAMUSCULAR | Status: AC
  Filled 2017-03-27: qty 2

## 2017-03-27 MED ORDER — OXYTOCIN 40 UNITS IN LACTATED RINGERS INFUSION - SIMPLE MED: 1.0000 m[IU]/min | INTRAVENOUS | Status: DC

## 2017-03-27 MED ORDER — PRENATAL MULTIVITAMIN CH
1.0000 | ORAL_TABLET | Freq: Every day | ORAL | Status: DC
  Administered 2017-03-28: 1 via ORAL
  Filled 2017-03-27: qty 1

## 2017-03-27 MED ORDER — EPHEDRINE 5 MG/ML INJ
10.0000 mg | INTRAVENOUS | Status: AC | PRN
  Administered 2017-03-27 (×2): 10 mg via INTRAVENOUS

## 2017-03-27 MED ORDER — CLINDAMYCIN PHOSPHATE 900 MG/50ML IV SOLN
900.0000 mg | Freq: Once | INTRAVENOUS | Status: AC
  Administered 2017-03-27: 900 mg via INTRAVENOUS
  Filled 2017-03-27: qty 50

## 2017-03-27 MED ORDER — EPHEDRINE 5 MG/ML INJ
10.0000 mg | INTRAVENOUS | Status: DC | PRN
  Administered 2017-03-27: 5 mg via INTRAVENOUS

## 2017-03-27 MED ORDER — ZOLPIDEM TARTRATE 5 MG PO TABS: 5.0000 mg | ORAL_TABLET | Freq: Every evening | ORAL | Status: DC | PRN

## 2017-03-27 SURGICAL SUPPLY — 39 items
BENZOIN TINCTURE PRP APPL 2/3 (GAUZE/BANDAGES/DRESSINGS) ×3 IMPLANT
CHLORAPREP W/TINT 26ML (MISCELLANEOUS) ×3 IMPLANT
CLAMP CORD UMBIL (MISCELLANEOUS) IMPLANT
CLOSURE STERI STRIP 1/2 X4 (GAUZE/BANDAGES/DRESSINGS) ×3 IMPLANT
CLOTH BEACON ORANGE TIMEOUT ST (SAFETY) ×3 IMPLANT
CONTAINER PREFILL 10% NBF 15ML (MISCELLANEOUS) IMPLANT
DRAPE SHEET LG 3/4 BI-LAMINATE (DRAPES) ×3 IMPLANT
DRSG OPSITE POSTOP 4X10 (GAUZE/BANDAGES/DRESSINGS) ×3 IMPLANT
ELECT REM PT RETURN 9FT ADLT (ELECTROSURGICAL) ×3
ELECTRODE REM PT RTRN 9FT ADLT (ELECTROSURGICAL) ×1 IMPLANT
EXTRACTOR VACUUM M CUP 4 TUBE (SUCTIONS) ×2 IMPLANT
EXTRACTOR VACUUM M CUP 4' TUBE (SUCTIONS) ×1
GLOVE BIO SURGEON STRL SZ 6.5 (GLOVE) ×2 IMPLANT
GLOVE BIO SURGEONS STRL SZ 6.5 (GLOVE) ×1
GLOVE BIOGEL PI IND STRL 7.0 (GLOVE) ×1 IMPLANT
GLOVE BIOGEL PI INDICATOR 7.0 (GLOVE) ×2
GOWN STRL REUS W/TWL LRG LVL3 (GOWN DISPOSABLE) ×6 IMPLANT
KIT ABG SYR 3ML LUER SLIP (SYRINGE) IMPLANT
NEEDLE HYPO 25X5/8 SAFETYGLIDE (NEEDLE) IMPLANT
NS IRRIG 1000ML POUR BTL (IV SOLUTION) ×3 IMPLANT
PACK C SECTION WH (CUSTOM PROCEDURE TRAY) ×3 IMPLANT
PAD OB MATERNITY 4.3X12.25 (PERSONAL CARE ITEMS) ×3 IMPLANT
PENCIL SMOKE EVAC W/HOLSTER (ELECTROSURGICAL) ×3 IMPLANT
RTRCTR C-SECT PINK 25CM LRG (MISCELLANEOUS) ×3 IMPLANT
STRIP CLOSURE SKIN 1/2X4 (GAUZE/BANDAGES/DRESSINGS) ×2 IMPLANT
SUT MNCRL 0 VIOLET CTX 36 (SUTURE) ×2 IMPLANT
SUT MONOCRYL 0 CTX 36 (SUTURE) ×4
SUT PLAIN 0 NONE (SUTURE) ×3 IMPLANT
SUT PLAIN 1 NONE 54 (SUTURE) IMPLANT
SUT PLAIN 2 0 XLH (SUTURE) ×3 IMPLANT
SUT VIC AB 0 CT1 27 (SUTURE) ×4
SUT VIC AB 0 CT1 27XBRD ANBCTR (SUTURE) ×2 IMPLANT
SUT VIC AB 2-0 CT1 27 (SUTURE) ×4
SUT VIC AB 2-0 CT1 TAPERPNT 27 (SUTURE) ×2 IMPLANT
SUT VIC AB 4-0 KS 27 (SUTURE) ×3 IMPLANT
SYR BULB IRRIGATION 50ML (SYRINGE) ×3 IMPLANT
TOWEL OR 17X24 6PK STRL BLUE (TOWEL DISPOSABLE) ×3 IMPLANT
TRAY FOLEY BAG SILVER LF 14FR (SET/KITS/TRAYS/PACK) ×3 IMPLANT
TRAY FOLEY CATH SILVER 16FR LF (SET/KITS/TRAYS/PACK) ×3 IMPLANT

## 2017-03-27 NOTE — Progress Notes (Signed)
Patient ID: Lori GavelKristin D Roy, female   DOB: 1986-10-22, 31 y.o.   MRN: [id number] Pt comfortable after stadol  Pitocin restarted at 2mus Pt states SROM was actually at 630am which would make her now 18hrs post ruptured membranes.  Will start on amp/gent now for infection prevention

## 2017-03-27 NOTE — Progress Notes (Signed)
MOB was referred for history of depression/anxiety. * Referral screened out by Clinical Social Worker because none of the following criteria appear to apply: ~ History of anxiety/depression during this pregnancy, or of post-partum depression. ~ Diagnosis of anxiety and/or depression within last 3 years OR * MOB's symptoms currently being treated with medication and/or therapy. Please contact the Clinical Social Worker if needs arise, or if MOB requests.  MOB has Rx for Zoloft.   

## 2017-03-27 NOTE — Anesthesia Procedure Notes (Signed)
Epidural Patient location during procedure: OB  Staffing Anesthesiologist: Analeya Luallen  Preanesthetic Checklist Completed: patient identified, site marked, surgical consent, pre-op evaluation, timeout performed, IV checked, risks and benefits discussed and monitors and equipment checked  Epidural Patient position: sitting Prep: site prepped and draped and DuraPrep Patient monitoring: continuous pulse ox and blood pressure Approach: midline Location: L3-L4 Injection technique: LOR air  Needle:  Needle type: Tuohy  Needle gauge: 17 G Needle length: 9 cm and 9 Needle insertion depth: 6 cm Catheter type: closed end flexible Catheter size: 19 Gauge Catheter at skin depth: 12 cm Test dose: negative  Assessment Events: blood not aspirated, injection not painful, no injection resistance, negative IV test and no paresthesia  Additional Notes Dosing of Epidural:  1st dose, through catheter .............................................  Xylocaine 40 mg  2nd dose, through catheter, after waiting 3 minutes.........Xylocaine 60 mg    As each dose occurred, patient was free of IV sx; and patient exhibited no evidence of SA injection.  Patient is more comfortable after epidural dosed. Please see RN's note for documentation of vital signs,and FHR which are stable.  Patient reminded not to try to ambulate with numb legs, and that an RN must be present when she attempts to get up.        

## 2017-03-27 NOTE — Brief Op Note (Signed)
03/26/2017 - 03/27/2017  12:37 PM  PATIENT:  Lori Roy  31 y.o. female  PRE-OPERATIVE DIAGNOSIS:  primary cesarean section repetative decelerations, undesired fertility  POST-OPERATIVE DIAGNOSIS:  primary cesarean section repetative deceleration, s/p BTL  PROCEDURE:  Procedure(s): CESAREAN SECTION WITH BILATERAL TUBAL LIGATION (N/A)  SURGEON:  Surgeon(s) and Role:    * Bovard-Stuckert, Adelaido Nicklaus, MD - Primary  ANESTHESIA:   epidural  EBL:  Total I/O In: 2000 [I.V.:2000] Out: 1450 [Urine:950; Blood:500]  BLOOD ADMINISTERED:none  DRAINS: Urinary Catheter (Foley)   LOCAL MEDICATIONS USED:  NONE  SPECIMEN:  Source of Specimen:  Placenta to L&D; B tubal segments to path  DISPOSITION OF SPECIMEN:  as noted  COUNTS:  YES  TOURNIQUET:  * No tourniquets in log *  DICTATION: .Other Dictation: Dictation Number [id number]  PLAN OF CARE: Admit to inpatient   PATIENT DISPOSITION:  PACU - hemodynamically stable.   Delay start of Pharmacological VTE agent (>24hrs) due to surgical blood loss or risk of bleeding: not applicable

## 2017-03-27 NOTE — Progress Notes (Signed)
Patient ID: Lori GavelKristin D Roy, female   DOB: 1985-12-27, 31 y.o.   MRN: [id number]  Pt with no complaints Cat 1, 130 1-2/80/-2 Ctxs q 3-5 mins  Plan: continue on pitocin and antibx           Expectant mgmt

## 2017-03-27 NOTE — Anesthesia Postprocedure Evaluation (Signed)
Anesthesia Post Note  Patient: Sherin QuarryKristin D Hallquist  Procedure(s) Performed: Procedure(s) (LRB): CESAREAN SECTION WITH BILATERAL TUBAL LIGATION (N/A)  Patient location during evaluation: PACU Anesthesia Type: Epidural Level of consciousness: awake, awake and alert and oriented Pain management: pain level controlled Vital Signs Assessment: post-procedure vital signs reviewed and stable Respiratory status: spontaneous breathing, nonlabored ventilation and respiratory function stable Cardiovascular status: stable Postop Assessment: no headache, no backache, epidural receding, patient able to bend at knees and no signs of nausea or vomiting Anesthetic complications: no        Last Vitals:  Vitals:   03/27/17 1334 03/27/17 1341  BP:  102/68  Pulse: 72   Resp: 18 15  Temp:      Last Pain:  Vitals:   03/27/17 1341  TempSrc:   PainSc: 5    Pain Goal:                 Cecile HearingStephen Edward Marry Kusch

## 2017-03-27 NOTE — Addendum Note (Signed)
Addendum  created 03/27/17 1901 by Elgie CongoMalinova, Nickie Warwick H, CRNA   Sign clinical note

## 2017-03-27 NOTE — Progress Notes (Signed)
Dr Desmond Lopeurk updated-pt medicated for pain, ok to remove epidural, updated pt moving feet and bending at knees. Approved for discharge from PACU.

## 2017-03-27 NOTE — Progress Notes (Addendum)
Patient ID: Lori GavelKristin D Roy, female   DOB: May 07, 1986, 31 y.o.   MRN: [id number]   D/w pt with slow cervical change, and reptetive decels and prolonged decel.  AF VSS gen NAD  D/w pt r/b/a of LTCS - including but nnot limited to bleeding, infection and damage to surrounding organs.  Also injury to infant and difficulty healing.  Answered questions  FHTs 130's, variable decels, + accels.  Category 2 toco Q 5min, now that pitocin is off  Will go when OR ready  Late entry: As preparing for cesarean, noted that patient desired tubal ligation, reviewed with her r/b/a will proceed.  30 day papers were signe 4/19, so as is [facility] will proceed, d/w pt.

## 2017-03-27 NOTE — Progress Notes (Signed)
Patient ID: Lori GavelKristin D Roy, female   DOB: [birth year], 31 y.o.   MRN: [id number] Fetal heart tones dropped into 60s Position changes employed, O2 via mask, pitocin stopped (was at 6mus); terb given  IUPC placed and amnioinfusion given ( 250 then another 250; maintain at 15950ml/hr) FSE also placed  Fetal heart tones recovered   SVE 3/90/-1  Will monitor and once able reinstate pitocin  Pt aware if recurrent decels continue there is likelihood we may need delivery via c/s

## 2017-03-27 NOTE — Progress Notes (Signed)
Patient ID: Lori GavelKristin D Roy, female   DOB: 04/10/1986, 31 y.o.   MRN: [id number]  Comfortable with epidural  AFVSS gen NAD FHTs 130's, mod var, + accels, category 1 toco occ ctx  SVE 4/80/0  D/w pt latent labor  Continue current mgmt. IOL with pitocin slowly

## 2017-03-27 NOTE — Transfer of Care (Signed)
Immediate Anesthesia Transfer of Care Note  Patient: Lori QuarryKristin D Evans  Procedure(s) Performed: Procedure(s): CESAREAN SECTION WITH BILATERAL TUBAL LIGATION (N/A)  Patient Location: PACU  Anesthesia Type:Epidural  Level of Consciousness: awake, alert  and oriented  Airway & Oxygen Therapy: Patient Spontanous Breathing  Post-op Assessment: Report given to RN and Post -op Vital signs reviewed and stable  Post vital signs: Reviewed and stable  Last Vitals:  Vitals:   03/27/17 1101 03/27/17 1104  BP: 115/78 (!) 119/94  Pulse: 78 (!) 189  Resp: 18 18  Temp:      Last Pain:  Vitals:   03/27/17 1101  TempSrc:   PainSc: 0-No pain         Complications: No apparent anesthesia complications

## 2017-03-27 NOTE — Anesthesia Postprocedure Evaluation (Signed)
Anesthesia Post Note  Patient: Lori Roy  Procedure(s) Performed: Procedure(s) (LRB): CESAREAN SECTION WITH BILATERAL TUBAL LIGATION (N/A)  Patient location during evaluation: Mother Baby Anesthesia Type: Epidural Level of consciousness: awake and alert and oriented Pain management: pain level controlled Vital Signs Assessment: post-procedure vital signs reviewed and stable Respiratory status: spontaneous breathing and nonlabored ventilation Cardiovascular status: stable Postop Assessment: no headache, patient able to bend at knees, no backache, no signs of nausea or vomiting, epidural receding and adequate PO intake Anesthetic complications: no        Last Vitals:  Vitals:   03/27/17 1615 03/27/17 1715  BP: 121/62 106/72  Pulse: 68 66  Resp: 18 18  Temp: 36.3 C 36.4 C    Last Pain:  Vitals:   03/27/17 1715  TempSrc: Oral  PainSc: 6    Pain Goal:                 [name],[name]

## 2017-03-27 NOTE — Anesthesia Preprocedure Evaluation (Addendum)
Anesthesia Evaluation  Patient identified by MRN, date of birth, ID band Patient awake    Reviewed: Allergy & Precautions, H&P , Patient's Chart, lab work & pertinent test results  Airway Mallampati: II  TM Distance: >3 FB Neck ROM: full    Dental  (+) Teeth Intact   Pulmonary former smoker,    breath sounds clear to auscultation       Cardiovascular  Rhythm:regular Rate:Normal     Neuro/Psych    GI/Hepatic   Endo/Other    Renal/GU      Musculoskeletal   Abdominal   Peds  Hematology   Anesthesia Other Findings       Reproductive/Obstetrics (+) Pregnancy                             Anesthesia Physical Anesthesia Plan  ASA: III and emergent  Anesthesia Plan: Epidural   Post-op Pain Management:    Induction:   Airway Management Planned:   Additional Equipment:   Intra-op Plan:   Post-operative Plan:   Informed Consent: I have reviewed the patients History and Physical, chart, labs and discussed the procedure including the risks, benefits and alternatives for the proposed anesthesia with the patient or authorized representative who has indicated his/her understanding and acceptance.   Dental Advisory Given  Plan Discussed with:   Anesthesia Plan Comments: (Labs checked- platelets confirmed with RN in room. Fetal heart tracing, per RN, reported to be stable enough for sitting procedure. Discussed epidural, and patient consents to the procedure:  included risk of possible headache,backache, failed block, allergic reaction, and nerve injury. This patient was asked if she had any questions or concerns before the procedure started.  Emergent C-section for fetal decelerations.)       Anesthesia Quick Evaluation

## 2017-03-28 LAB — CBC
HEMATOCRIT: 26.3 % — AB (ref 36.0–46.0)
HEMOGLOBIN: 8.8 g/dL — AB (ref 12.0–15.0)
MCH: 28.6 pg (ref 26.0–34.0)
MCHC: 33.5 g/dL (ref 30.0–36.0)
MCV: 85.4 fL (ref 78.0–100.0)
Platelets: 236 10*3/uL (ref 150–400)
RBC: 3.08 MIL/uL — AB (ref 3.87–5.11)
RDW: 14.1 % (ref 11.5–15.5)
WBC: 10 10*3/uL (ref 4.0–10.5)

## 2017-03-28 LAB — BIRTH TISSUE RECOVERY COLLECTION (PLACENTA DONATION)

## 2017-03-28 NOTE — Progress Notes (Signed)
Subjective: Postpartum Day 1: Cesarean Delivery Patient reports incisional pain and tolerating PO.  Nl lochia, pain controlled  Objective: Vital signs in last 24 hours: Temp:  [97.3 F (36.3 C)-97.9 F (36.6 C)] 97.7 F (36.5 C) (05/12 0535) Pulse Rate:  [57-189] 66 (05/12 0535) Resp:  [5-22] 18 (05/12 0535) BP: (102-121)/(46-94) 102/51 (05/12 0535) SpO2:  [94 %-100 %] 99 % (05/12 0535)  Physical Exam:  General: alert and no distress Lochia: appropriate Uterine Fundus: firm Incision: healing well DVT Evaluation: No evidence of DVT seen on physical exam.   Recent Labs  03/26/17 1350 03/28/17 0535  HGB 10.3* 8.8*  HCT 30.7* 26.3*    Assessment/Plan: Status post Cesarean section. Doing well postoperatively.  Continue current care.  Trafton Roker Bovard-Stuckert 03/28/2017, 10:03 AM

## 2017-03-28 NOTE — Lactation Note (Signed)
This note was copied from a baby's chart. RN asked to assist w/hand expression & spoon feedings after breast feeding to ensure adequate intake.   Lori Roy, [facility] Roy [facility] 03/28/2017, 12:01 AM

## 2017-03-29 MED ORDER — IBUPROFEN 800 MG PO TABS: 800.0000 mg | ORAL_TABLET | Freq: Three times a day (TID) | ORAL | 1 refills | Status: AC

## 2017-03-29 MED ORDER — OXYCODONE HCL 5 MG PO TABS: ORAL_TABLET | ORAL | 0 refills | Status: DC

## 2017-03-29 MED ORDER — PRENATAL MULTIVITAMIN CH: 1.0000 | ORAL_TABLET | Freq: Every day | ORAL | 3 refills | Status: DC

## 2017-03-29 NOTE — Discharge Instructions (Signed)
Iron-Rich Diet ° °Iron is a mineral that helps your body to produce hemoglobin. Hemoglobin is a protein in your red blood cells that carries oxygen to your body's tissues. Eating too little iron may cause you to feel weak and tired, and it can increase your risk for infection. Eating enough iron is necessary for your body's metabolism, muscle function, and nervous system. °Iron is naturally found in many foods. It can also be added to foods or fortified in foods. There are two types of dietary iron: °· Heme iron. Heme iron is absorbed by the body more easily than nonheme iron. Heme iron is found in meat, poultry, and fish. °· Nonheme iron. Nonheme iron is found in dietary supplements, iron-fortified grains, beans, and vegetables. °You may need to follow an iron-rich diet if: °· You have been diagnosed with iron deficiency or iron-deficiency anemia. °· You have a condition that prevents you from absorbing dietary iron, such as: °¨ Infection in your intestines. °¨ Celiac disease. This involves long-lasting (chronic) inflammation of your intestines. °· You do not eat enough iron. °· You eat a diet that is high in foods that impair iron absorption. °· You have lost a lot of blood. °· You have heavy bleeding during your menstrual cycle. °· You are pregnant. °What is my plan? °Your health care provider may help you to determine how much iron you need per day based on your condition. Generally, when a person consumes sufficient amounts of iron in the diet, the following iron needs are met: °· Men. °¨ 14-18 years old: 11 mg per day. °¨ 19-50 years old: 8 mg per day. °· Women. °¨ 14-18 years old: 15 mg per day. °¨ 19-50 years old: 18 mg per day. °¨ Over 50 years old: 8 mg per day. °¨ Pregnant women: 27 mg per day. °¨ Breastfeeding women: 9 mg per day. °What do I need to know about an iron-rich diet? °· Eat fresh fruits and vegetables that are high in vitamin C along with foods that are high in iron. This will help increase  the amount of iron that your body absorbs from food, especially with foods containing nonheme iron. Foods that are high in vitamin C include oranges, peppers, tomatoes, and mango. °· Take iron supplements only as directed by your health care provider. Overdose of iron can be life-threatening. If you were prescribed iron supplements, take them with orange juice or a vitamin C supplement. °· Cook foods in pots and pans that are made from iron. °· Eat nonheme iron-containing foods alongside foods that are high in heme iron. This helps to improve your iron absorption. °· Certain foods and drinks contain compounds that impair iron absorption. Avoid eating these foods in the same meal as iron-rich foods or with iron supplements. These include: °¨ Coffee, black tea, and red wine. °¨ Milk, dairy products, and foods that are high in calcium. °¨ Beans, soybeans, and peas. °¨ Whole grains. °· When eating foods that contain both nonheme iron and compounds that impair iron absorption, follow these tips to absorb iron better. °¨ Soak beans overnight before cooking. °¨ Soak whole grains overnight and drain them before using. °¨ Ferment flours before baking, such as using yeast in bread dough. °What foods can I eat? °Grains  °Iron-fortified breakfast cereal. Iron-fortified whole-wheat bread. Enriched rice. Sprouted grains. °Vegetables  °Spinach. Potatoes with skin. Green peas. Broccoli. Red and green bell peppers. Fermented vegetables. °Fruits  °Prunes. Raisins. Oranges. Strawberries. Mango. Grapefruit. °Meats and Other Protein   Sources  °Beef liver. Oysters. Beef. Shrimp. Turkey. Chicken. Tuna. Sardines. Chickpeas. Nuts. Tofu. °Beverages  °Tomato juice. Fresh orange juice. Prune juice. Hibiscus tea. Fortified instant breakfast shakes. °Condiments  °Tahini. Fermented soy sauce. °Sweets and Desserts  °Black-strap molasses. °Other  °Wheat germ. °The items listed above may not be a complete list of recommended foods or beverages.  Contact your dietitian for more options.  °What foods are not recommended? °Grains  °Whole grains. Bran cereal. Bran flour. Oats. °Vegetables  °Artichokes. Brussels sprouts. Kale. °Fruits  °Blueberries. Raspberries. Strawberries. Figs. °Meats and Other Protein Sources  °Soybeans. Products made from soy protein. °Dairy  °Milk. Cream. Cheese. Yogurt. Cottage cheese. °Beverages  °Coffee. Black tea. Red wine. °Sweets and Desserts  °Cocoa. Chocolate. Ice cream. °Other  °Basil. Oregano. Parsley. °The items listed above may not be a complete list of foods and beverages to avoid. Contact your dietitian for more information.  °This information is not intended to replace advice given to you by your health care provider. Make sure you discuss any questions you have with your health care provider. °Document Released: 06/17/2005 Document Revised: 05/23/2016 Document Reviewed: 05/31/2014 °Elsevier Interactive Patient Education © 2017 [employer] Inc. ° ° ° °

## 2017-03-29 NOTE — Lactation Note (Signed)
This note was copied from a baby's chart. Lactation Consultation Note  Patient Name: Lori Roy ZOXWR'UToday's Date: 03/29/2017 Reason for consult: Follow-up assessment;Infant < 6lbs (baby 4 lbs 14.8 oz today, but only at 4% wt loss, exclusively breast feeding)   Experienced breast feeding mom and baby, now 244 hours old and at 4% weight loss, with exclusive breast feeding. I informed mom of the feed breast feeding group, where she can check the baby's weight. And I advised her to soften her breast as much as possible, before transferring to the next, so the baby gets hind milk. Mom very relaxed and doing well, and knows to call for questions/cocnerns.    Maternal Data    Feeding Feeding Type: Breast Fed Length of feed: 10 min  LATCH Score/Interventions                      Lactation Tools Discussed/Used     Consult Status Consult Status: Complete Follow-up type: Call as needed    Alfred LevinsLee, Raisa Ditto Anne 03/29/2017, 8:12 AM

## 2017-03-29 NOTE — Discharge Summary (Signed)
OB Discharge Summary     Patient Name: Lori GavelKristin D Entsminger DOB: July 07, 1986 MRN: [id number]  Date of admission: 03/26/2017 Delivering MD: Sherian ReinBOVARD-STUCKERT, Indigo Barbian   Date of discharge: 03/29/2017  Admitting diagnosis: LABOR, SROM Intrauterine pregnancy: [id number]     Secondary diagnosis:  Principal Problem:   Delivery of pregnancy by cesarean section Active Problems:   Delayed delivery after SROM (spontaneous rupture of membranes)  Additional problems: repetitive decels     Discharge diagnosis: Term Pregnancy Delivered                                                                                                Post partum procedures:tubal ligation with LTCS  Augmentation: Pitocin  Complications: None  Hospital course:  Onset of Labor With Unplanned C/S  31 y.o. yo [id number] at 6772w6d was admitted in Latent Labor on 03/26/2017. Patient had a labor course significant for SROM and repetitive decels. Membrane Rupture Time/Date: 9:00 AM ,03/26/2017   The patient went for cesarean section due to repetitive decels, and delivered a Viable infant,03/27/2017  Details of operation can be found in separate operative note.  Also had a BTL.   Patient had an uncomplicated postpartum course.  She is ambulating,tolerating a regular diet, passing flatus, and urinating well.  Patient is discharged home in stable condition 03/29/17.  Physical exam  Vitals:   03/28/17 0535 03/28/17 1045 03/28/17 1736 03/29/17 0618  BP: (!) 102/51 (!) 116/52 (!) 125/58 127/66  Pulse: 66 76 72 68  Resp: 18 20 18 20   Temp: 97.7 F (36.5 C) 98 F (36.7 C) 97.9 F (36.6 C) 98.3 F (36.8 C)  TempSrc: Oral  Oral Oral  SpO2: 99% 96%    Weight:      Height:       General: alert and no distress Lochia: appropriate Uterine Fundus: firm Incision: Healing well with no significant drainage DVT Evaluation: No evidence of DVT seen on physical exam. Labs: Lab Results  Component Value Date   WBC 10.0 03/28/2017   HGB 8.8 (L)  03/28/2017   HCT 26.3 (L) 03/28/2017   MCV 85.4 03/28/2017   PLT 236 03/28/2017   CMP Latest Ref Rng & Units 03/18/2016  Glucose 65 - 99 mg/dL 94  BUN 6 - 20 mg/dL 9  Creatinine 4.130.44 - 2.441.00 mg/dL 0.100.68  Sodium 272135 - 536145 mmol/L 143  Potassium 3.5 - 5.1 mmol/L 3.7  Chloride 101 - 111 mmol/L 111  CO2 22 - 32 mmol/L 21(L)  Calcium 8.9 - 10.3 mg/dL 9.5  Total Protein 6.5 - 8.1 g/dL -  Total Bilirubin 0.3 - 1.2 mg/dL -  Alkaline Phos 38 - 644126 U/L -  AST 15 - 41 U/L -  ALT 14 - 54 U/L -    Discharge instruction: per After Visit Summary and "Baby and Me Booklet".  After visit meds:  Allergies as of 03/29/2017   No Known Allergies     Medication List    TAKE these medications   acetaminophen 500 MG tablet Commonly known as:  TYLENOL Take 500 mg by mouth every 6 (six) hours as  needed for headache.   butalbital-acetaminophen-caffeine 50-325-40 MG tablet Commonly known as:  FIORICET, ESGIC Take 1 tablet by mouth every 6 (six) hours as needed for headache or migraine.   calcium carbonate 500 MG chewable tablet Commonly known as:  TUMS - dosed in mg elemental calcium Chew 1 tablet by mouth 2 (two) times daily as needed for indigestion or heartburn.   ibuprofen 800 MG tablet Commonly known as:  ADVIL,MOTRIN Take 1 tablet (800 mg total) by mouth every 8 (eight) hours.   oxyCODONE 5 MG immediate release tablet Commonly known as:  Oxy IR/ROXICODONE 1-2 tablets q 6 hour prn/severe pain   prenatal multivitamin Tabs tablet Take 1 tablet by mouth daily at 12 noon.   sertraline 50 MG tablet Commonly known as:  ZOLOFT Take 50 mg by mouth daily.       Diet: routine diet  Activity: Advance as tolerated. Pelvic rest for 6 weeks.   Outpatient follow up:2 and 6 weeks Follow up Appt:No future appointments. Follow up Visit:No Follow-up on file.  Postpartum contraception: Tubal Ligation  Newborn Data: Live born female  Birth Weight: 5 lb 2.5 oz (2340 g) APGAR: 9, 9  Baby  Feeding: Breast Disposition:home with mother   03/29/2017 Sherian Rein, MD

## 2017-03-29 NOTE — Progress Notes (Signed)
Subjective: Postpartum Day 2: Cesarean Delivery Patient reports incisional pain and tolerating PO.  Nl lochia, pain controlled  Objective: Vital signs in last 24 hours: Temp:  [97.9 F (36.6 C)-98.3 F (36.8 C)] 98.3 F (36.8 C) (05/13 0618) Pulse Rate:  [68-76] 68 (05/13 0618) Resp:  [18-20] 20 (05/13 0618) BP: (116-127)/(52-66) 127/66 (05/13 0618) SpO2:  [96 %] 96 % (05/12 1045)  Physical Exam:  General: alert and no distress Lochia: appropriate Uterine Fundus: firm Incision: healing well DVT Evaluation: No evidence of DVT seen on physical exam.   Recent Labs  03/26/17 1350 03/28/17 0535  HGB 10.3* 8.8*  HCT 30.7* 26.3*    Assessment/Plan: Status post Cesarean section. Doing well postoperatively.  Discharge home, at pt's request, with standard precautions and return to clinic in 2 weeks.  D/C with Motrin, percocet and PNV.    Lori Roy 03/29/2017, 9:18 AM

## 2017-03-30 NOTE — Op Note (Signed)
NAME:  Lori Roy, Lori Roy               ACCOUNT NO.:  MEDICAL RECORD NO.:  [id number]  LOCATION:                                 FACILITY:  PHYSICIAN:  Sherron MondayJody Bovard, MD             DATE OF BIRTH:  DATE OF PROCEDURE:  03/27/2017 DATE OF DISCHARGE:                              OPERATIVE REPORT   PREOPERATIVE DIAGNOSES:  Intrauterine pregnancy at term, repetitive decelerations, for primary cesarean section, undesired fertility.  POSTOPERATIVE DIAGNOSES:  Intrauterine pregnancy at term, repetitive decelerations, for primary cesarean section, undesired fertility, status post bilateral tubal ligation surgery.  PROCEDURES:  Low-transverse cesarean section with bilateral tubal ligation by Pomeroy method.  SURGEON:  Sherron MondayJody Bovard, MD  ANESTHESIA:  Epidural.  IV FLUIDS:  2000 mL.  URINE OUTPUT:  950 mL, clear urine at the end of the procedure.  ESTIMATED BLOOD LOSS:  Approximately 500 mL.  COMPLICATIONS:  None.  PATHOLOGY:  Placenta to L and D and bilateral tubal segments to Pathology.  DESCRIPTION OF PROCEDURE:  After informed consent was reviewed with the patient including risks, benefits, and alternatives of surgical procedure including, but not limited to, bleeding, infection, damage to surrounding organs as well as injury to infant or child with difficulty healing, and again the tubal ligation risks and benefits were reviewed with her, although her 30-day papers were not mature like it was thought to be an emergency situation as her abdomen would be open for the procedure.  The decision was made to proceed with a tubal ligation.  She was taken to the operating room.  After her epidural was redosed, placed on the table in supine position with a leftward tilt.  Once the level of epidural was found to be satisfactory, she was prepped and draped in normal sterile fashion.  A Foley catheter had been previously placed.  A Pfannenstiel skin incision was made at the level 2  fingerbreadths above the pubic symphysis, carried through to the underlying layer of fascia sharply.  The fascia was incised in the midline.  The incision was extended laterally with Mayo scissors.  The superior aspect of the fascial incision was grasped with Kocher clamps, elevated, and the rectus muscles were dissected off both bluntly and sharply on the peritoneum.  A midline was easily identified.  Peritoneum was entered bluntly.  The incision was extended superiorly and inferiorly with good visualization of the bladder.  Alexis skin retractor was placed, carefully making sure that no bowel was entrapped.  The uterus was incised in transverse fashion and the incision was extended with bandage scissors.  The infant was delivered from vertex presentation with the aid of a vacuum.  Nose and mouth were suctioned on the field.  Cord was clamped and cut.  Cord was awaited a minute to be clamped.  The placenta was expressed from the uterus and the uterus was cleared of all clot and debris.  Uterine incision was closed in 2 layers with 0 Monocryl, first of which was running locked and the second as an imbricating layer.  The tubes were identified bilaterally and followed out to the fimbriated end.  The tube was elevated with a  Babcock.  A window was created in the mesosalpinx, tied bilaterally with 0 plain gut, noted to be hemostatic. The peritoneum was reapproximated with 2-0 Vicryl in a running fashion. The subfascial planes were inspected and found to be hemostatic.  The fascia was closed with 0 Monocryl in a single suture.  The subcuticular adipose layer was made hemostatic with Bovie cautery.  A single suture of 3-0 Vicryl was used to close the dead space.  The skin was closed with 4-0 Vicryl on a Keith needle.  Benzoin and Steri-Strips were applied.  Sponge, lap, and needle counts were correct x2 per the operating staff.     Sherron Monday, MD     JB/MEDQ  D:  03/27/2017  T:   03/27/2017  Job:  [id number]

## 2017-04-13 ENCOUNTER — Inpatient Hospital Stay (HOSPITAL_COMMUNITY)
Admission: AD | Admit: 2017-04-13 | Discharge: 2017-04-13 | Disposition: A | Discharge status: Home / Self Care | Payer: Medicaid Other | Source: Ambulatory Visit | Attending: Obstetrics and Gynecology | Admitting: Obstetrics and Gynecology

## 2017-04-13 ENCOUNTER — Encounter (HOSPITAL_COMMUNITY): Payer: Self-pay | Admitting: *Deleted

## 2017-04-13 DIAGNOSIS — Z87891 Personal history of nicotine dependence: Secondary | ICD-10-CM | POA: Diagnosis not present

## 2017-04-13 DIAGNOSIS — G8918 Other acute postprocedural pain: Secondary | ICD-10-CM | POA: Diagnosis not present

## 2017-04-13 DIAGNOSIS — F419 Anxiety disorder, unspecified: Secondary | ICD-10-CM | POA: Insufficient documentation

## 2017-04-13 DIAGNOSIS — Z4889 Encounter for other specified surgical aftercare: Secondary | ICD-10-CM | POA: Insufficient documentation

## 2017-04-13 DIAGNOSIS — R030 Elevated blood-pressure reading, without diagnosis of hypertension: Secondary | ICD-10-CM | POA: Diagnosis not present

## 2017-04-13 NOTE — MAU Note (Signed)
C-section on 5/11 States incision feels swollen, burns and stings x 1 week that has gotten worse.

## 2017-04-13 NOTE — MAU Provider Note (Signed)
Patient Lori Roy is a 31 y.o. G3P3003  At 2 weeks postpartum here with complains of her c-section incision bothering her.  History     CSN: [id number]  Arrival date and time: 04/13/17 1258   First Provider Initiated Contact with Patient 04/13/17 1328      Chief Complaint  Patient presents with  . Incisional Pain   Wound Check  She was originally treated 10 to 14 days ago. Previous treatment included IV/IM antibiotics. Her temperature was unmeasured prior to arrival. There has been no drainage from the wound. Redness Status: incision has some pink erthymea around the edges. There is no swelling present. Pain course: patient feels pain only on the right side. She has no difficulty moving the affected extremity or digit.  She rates it a burning, itching pain a 6/10. It feels better when she is not moving. She has been picking up her toddler and this has made the incision hurt more.   OB History    Gravida Para Term Preterm AB Living   3 3 3  0 0 3   SAB TAB Ectopic Multiple Live Births   0 0 0 0 3      Past Medical History:  Diagnosis Date  . Anxiety   . Delivery of pregnancy by cesarean section 03/27/2017  . Depression   . Iron deficiency anemia   . Migraines   . Normal pregnancy 07/12/2011  . SROM (spontaneous rupture of membranes) 07/12/2011  . SVD (spontaneous vaginal delivery) 07/13/2011    Past Surgical History:  Procedure Laterality Date  . CESAREAN SECTION WITH BILATERAL TUBAL LIGATION N/A 03/27/2017   Procedure: CESAREAN SECTION WITH BILATERAL TUBAL LIGATION;  Surgeon: Sherian ReinBovard-Stuckert, Jody, MD;  Location: [facility];  Service: Obstetrics;  Laterality: N/A;  . CHOLECYSTECTOMY N/A 05/11/2013   Procedure: LAPAROSCOPIC CHOLECYSTECTOMY WITH INTRAOPERATIVE CHOLANGIOGRAM;  Surgeon: Cherylynn RidgesJames O Wyatt, MD;  Location: [facility];  Service: General;  Laterality: N/A;  . WISDOM TOOTH EXTRACTION  ~ 2006    History reviewed. No pertinent family history.  Social History   Substance Use Topics  . Smoking status: Former Smoker    Packs/day: 0.10    Years: 3.00    Types: Cigarettes    Quit date: 11/17/2010  . Smokeless tobacco: Never Used  . Alcohol use No    Allergies: No Known Allergies  Prescriptions Prior to Admission  Medication Sig Dispense Refill Last Dose  . acetaminophen (TYLENOL) 500 MG tablet Take 1,000 mg by mouth every 6 (six) hours as needed for headache.    04/12/2017 at Unknown time  . butalbital-acetaminophen-caffeine (FIORICET, ESGIC) 50-325-40 MG tablet Take 1 tablet by mouth every 6 (six) hours as needed for headache or migraine.   04/12/2017 at Unknown time  . calcium carbonate (TUMS - DOSED IN MG ELEMENTAL CALCIUM) 500 MG chewable tablet Chew 1-2 tablets by mouth 2 (two) times daily as needed for indigestion or heartburn.    Past Week at Unknown time  . ibuprofen (ADVIL,MOTRIN) 800 MG tablet Take 1 tablet (800 mg total) by mouth every 8 (eight) hours. 45 tablet 1 04/13/2017 at Unknown time  . oxyCODONE (OXY IR/ROXICODONE) 5 MG immediate release tablet 1-2 tablets q 6 hour prn/severe pain (Patient taking differently: Take 5-10 mg by mouth every 6 (six) hours as needed for severe pain. 1-2 tablets q 6 hour prn/severe pain) 30 tablet 0 Past Week at Unknown time  . Prenatal Vit-Fe Fumarate-FA (PRENATAL MULTIVITAMIN) TABS tablet Take 1 tablet by mouth daily at  12 noon. 100 tablet 3 04/12/2017 at Unknown time  . sertraline (ZOLOFT) 50 MG tablet Take 50 mg by mouth daily.   04/13/2017 at Unknown time  . triamcinolone cream (KENALOG) 0.1 % Apply 1 application topically 2 (two) times daily.   04/12/2017 at Unknown time    Review of Systems  Respiratory: Positive for chest tightness.        Feels like she has a knot in her upper stomach, feels like anxiety to her  Gastrointestinal: Positive for abdominal pain. Negative for constipation and diarrhea.  Genitourinary: Negative.   Musculoskeletal: Negative.   Neurological: Negative.    Physical Exam    Blood pressure (!) 150/89, pulse 68, temperature 98.3 F (36.8 C), temperature source Oral, resp. rate 18, SpO2 98 %, unknown if currently breastfeeding.  Physical Exam  Constitutional: She is oriented to person, place, and time. She appears well-developed and well-nourished.  HENT:  Head: Normocephalic.  Neck: Normal range of motion.  Respiratory: Effort normal.  GI: Soft. Bowel sounds are normal.  Musculoskeletal: Normal range of motion.  Neurological: She is alert and oriented to person, place, and time.  Skin: Skin is warm and dry.  Psychiatric: She has a normal mood and affect.  Incision is clean, dry and intact. No pus, no drainage, no signs of dehiscence or opening. No warmth or swelling.   MAU Course  Procedures  MDM Patient very tearful at times; feels overwhelmed with taking care of her infant and two small children. Her husband works a lot; her mother is very helpful.  Dr. Ellyn Hack at bedside to evaluate patient.  Systolic blood pressure elevated in MAU, however patient says that she is extremely anxious and has been under a lot of stress. She denies epigastric/LUQ pain, blurry vision, floating spots, and swelling. She does endorse a HA, but says she "always has a headache".  Assessment and Plan   1. Encounter for post surgical wound check    2. Per Dr. Ellyn Hack, patient stable for discharge. Will re-check blood pressure on Thursday and consider increasing anti-anxiety medications.  3. Patient to keep appointment with Dr. Ellyn Hack on Thursday; return to MAU if any concerning changes (increased abdominal pain, blurry vision, sudden swelling)  Charlesetta Garibaldi Kooistra 04/13/2017, 1:32 PM

## 2017-04-13 NOTE — Discharge Instructions (Signed)
Incision Care, Adult °An incision is a cut that a doctor makes in your skin for surgery (for a procedure). Most times, these cuts are closed after surgery. Your cut from surgery may be closed with stitches (sutures), staples, skin glue, or skin tape (adhesive strips). You may need to return to your doctor to have stitches or staples taken out. This may happen many days or many weeks after your surgery. The cut needs to be well cared for so it does not get infected. °How to care for your cut °Cut care °· Follow instructions from your doctor about how to take care of your cut. Make sure you: °? Wash your hands with soap and water before you change your bandage (dressing). If you cannot use soap and water, use hand sanitizer. °? Change your bandage as told by your doctor. °? Leave stitches, skin glue, or skin tape in place. They may need to stay in place for 2 weeks or longer. If tape strips get loose and curl up, you may trim the loose edges. Do not remove tape strips completely unless your doctor says it is okay. °· Check your cut area every day for signs of infection. Check for: °? More redness, swelling, or pain. °? More fluid or blood. °? Warmth. °? Pus or a bad smell. °· Ask your doctor how to clean the cut. This may include: °? Using mild soap and water. °? Using a clean towel to pat the cut dry after you clean it. °? Putting a cream or ointment on the cut. Do this only as told by your doctor. °? Covering the cut with a clean bandage. °· Ask your doctor when you can leave the cut uncovered. °· Do not take baths, swim, or use a hot tub until your doctor says it is okay. Ask your doctor if you can take showers. You may only be allowed to take sponge baths for bathing. °Medicines °· If you were prescribed an antibiotic medicine, cream, or ointment, take the antibiotic or put it on the cut as told by your doctor. Do not stop taking or putting on the antibiotic even if your condition gets better. °· Take  over-the-counter and prescription medicines only as told by your doctor. °General instructions °· Limit movement around your cut. This helps healing. °? Avoid straining, lifting, or exercise for the first month, or for as long as told by your doctor. °? Follow instructions from your doctor about going back to your normal activities. °? Ask your doctor what activities are safe. °· Protect your cut from the sun when you are outside for the first 6 months, or for as long as told by your doctor. Put on sunscreen around the scar or cover up the scar. °· Keep all follow-up visits as told by your doctor. This is important. °Contact a doctor if: °· Your have more redness, swelling, or pain around the cut. °· You have more fluid or blood coming from the cut. °· Your cut feels warm to the touch. °· You have pus or a bad smell coming from the cut. °· You have a fever or shaking chills. °· You feel sick to your stomach (nauseous) or you throw up (vomit). °· You are dizzy. °· Your stitches or staples come undone. °Get help right away if: °· You have a red streak coming from your cut. °· Your cut bleeds through the bandage and the bleeding does not stop with gentle pressure. °· The edges of your cut open   up and separate. °· You have very bad (severe) pain. °· You have a rash. °· You are confused. °· You pass out (faint). °· You have trouble breathing and you have a fast heartbeat. °This information is not intended to replace advice given to you by your health care provider. Make sure you discuss any questions you have with your health care provider. °Document Released: 01/26/2012 Document Revised: 07/11/2016 Document Reviewed: 07/11/2016 °Elsevier Interactive Patient Education © 2017 [employer] ° °

## 2017-06-12 ENCOUNTER — Encounter (HOSPITAL_COMMUNITY): Payer: Self-pay | Admitting: Emergency Medicine

## 2017-06-12 ENCOUNTER — Emergency Department (HOSPITAL_COMMUNITY): Payer: Medicaid Other

## 2017-06-12 ENCOUNTER — Emergency Department (HOSPITAL_COMMUNITY)
Admission: EM | Admit: 2017-06-12 | Discharge: 2017-06-13 | Disposition: A | Discharge status: Home / Self Care | Payer: Medicaid Other | Attending: Emergency Medicine | Admitting: Emergency Medicine

## 2017-06-12 DIAGNOSIS — R112 Nausea with vomiting, unspecified: Secondary | ICD-10-CM | POA: Insufficient documentation

## 2017-06-12 DIAGNOSIS — F329 Major depressive disorder, single episode, unspecified: Secondary | ICD-10-CM | POA: Diagnosis not present

## 2017-06-12 DIAGNOSIS — F419 Anxiety disorder, unspecified: Secondary | ICD-10-CM | POA: Diagnosis not present

## 2017-06-12 DIAGNOSIS — Z87891 Personal history of nicotine dependence: Secondary | ICD-10-CM | POA: Diagnosis not present

## 2017-06-12 DIAGNOSIS — Z9049 Acquired absence of other specified parts of digestive tract: Secondary | ICD-10-CM | POA: Diagnosis not present

## 2017-06-12 DIAGNOSIS — Z79899 Other long term (current) drug therapy: Secondary | ICD-10-CM | POA: Diagnosis not present

## 2017-06-12 DIAGNOSIS — I10 Essential (primary) hypertension: Secondary | ICD-10-CM | POA: Insufficient documentation

## 2017-06-12 DIAGNOSIS — R51 Headache: Secondary | ICD-10-CM | POA: Diagnosis present

## 2017-06-12 DIAGNOSIS — R519 Headache, unspecified: Secondary | ICD-10-CM

## 2017-06-12 LAB — BASIC METABOLIC PANEL
ANION GAP: 12 (ref 5–15)
BUN: 5 mg/dL — ABNORMAL LOW (ref 6–20)
CALCIUM: 9.2 mg/dL (ref 8.9–10.3)
CO2: 25 mmol/L (ref 22–32)
CREATININE: 0.75 mg/dL (ref 0.44–1.00)
Chloride: 103 mmol/L (ref 101–111)
GFR calc non Af Amer: >60 mL/min (ref >60)
Glucose, Bld: 136 mg/dL — ABNORMAL HIGH (ref 65–99)
Potassium: 3.8 mmol/L (ref 3.5–5.1)
SODIUM: 140 mmol/L (ref 135–145)

## 2017-06-12 LAB — URINALYSIS, ROUTINE W REFLEX MICROSCOPIC
Bilirubin Urine: NEGATIVE
GLUCOSE, UA: NEGATIVE mg/dL
Hgb urine dipstick: NEGATIVE
Ketones, ur: NEGATIVE mg/dL
Leukocytes, UA: NEGATIVE
Nitrite: NEGATIVE
PROTEIN: NEGATIVE mg/dL
SPECIFIC GRAVITY, URINE: 1.019 (ref 1.005–1.030)
pH: 7 (ref 5.0–8.0)

## 2017-06-12 LAB — CBC
HCT: 36.5 % (ref 36.0–46.0)
HEMOGLOBIN: 11.6 g/dL — AB (ref 12.0–15.0)
MCH: 25.3 pg — ABNORMAL LOW (ref 26.0–34.0)
MCHC: 31.8 g/dL (ref 30.0–36.0)
MCV: 79.7 fL (ref 78.0–100.0)
PLATELETS: 312 10*3/uL (ref 150–400)
RBC: 4.58 MIL/uL (ref 3.87–5.11)
RDW: 14.3 % (ref 11.5–15.5)
WBC: 12.4 10*3/uL — ABNORMAL HIGH (ref 4.0–10.5)

## 2017-06-12 LAB — HCG, QUANTITATIVE, PREGNANCY

## 2017-06-12 MED ORDER — ONDANSETRON HCL 4 MG/2ML IJ SOLN: 4.0000 mg | Freq: Once | INTRAMUSCULAR | Status: DC

## 2017-06-12 MED ORDER — MORPHINE SULFATE (PF) 4 MG/ML IV SOLN: 4.0000 mg | Freq: Once | INTRAVENOUS | Status: DC

## 2017-06-12 NOTE — ED Triage Notes (Signed)
Pt reports HA present since 3p with N/V, photosensitivity. Pt hypertensive in triage.

## 2017-06-12 NOTE — ED Provider Notes (Signed)
[facility] Provider Note   CSN: [id number] Arrival date & time: 06/12/17  2114     History   Chief Complaint Chief Complaint  Patient presents with  . Migraine    HPI Lori Roy is a 31 y.o. female.  HPI   Lori Roy is a 31 y.o. female, with a history of migraines, presenting to the ED with a headache beginning around 3pm today. Headache came on suddenly with patient at rest. Began on the left side above her eye, radiated rearward. Accompanied by diaphoresis and vomiting. Photophobia and phonophobia.  History of migraines, but this felt different in intensity and speed of onset. 10/10, now global, but worst above left eye. Currently still nauseous, but patient states the headache feels more like a normal migraine. The difference though is that it hasn't responded to her normal regimen of Tylenol or Excedrin migraine.  Denies LOC, visual deficit, weakness, numbness, CP, SOB, dizziness, lightheadedness, or any other complaints.  Patient delivered her daughter on Mar 27, 2017 via C-section. Her OB/GYN is Financial risk analyst with [facility]. She states she had one episode of HTN during her pregnancy, but no diagnosis of preeclampsia.   No history of HTN.    Past Medical History:  Diagnosis Date  . Anxiety   . Delivery of pregnancy by cesarean section 03/27/2017  . Depression   . Iron deficiency anemia   . Migraines   . Normal pregnancy 07/12/2011  . SROM (spontaneous rupture of membranes) 07/12/2011  . SVD (spontaneous vaginal delivery) 07/13/2011    Patient Active Problem List   Diagnosis Date Noted  . Delivery of pregnancy by cesarean section 03/27/2017  . Delayed delivery after SROM (spontaneous rupture of membranes) 03/26/2017  . Normal labor 07/31/2014  . NSVD (normal spontaneous vaginal delivery) 07/31/2014  . Cholelithiasis 05/10/2013  . SVD (spontaneous vaginal delivery) 07/13/2011  . History of migraines     Past Surgical History:    Procedure Laterality Date  . CESAREAN SECTION WITH BILATERAL TUBAL LIGATION N/A 03/27/2017   Procedure: CESAREAN SECTION WITH BILATERAL TUBAL LIGATION;  Surgeon: Sherian Rein, MD;  Location: [facility];  Service: Obstetrics;  Laterality: N/A;  . CHOLECYSTECTOMY N/A 05/11/2013   Procedure: LAPAROSCOPIC CHOLECYSTECTOMY WITH INTRAOPERATIVE CHOLANGIOGRAM;  Surgeon: Cherylynn Ridges, MD;  Location: [facility] OR;  Service: General;  Laterality: N/A;  . WISDOM TOOTH EXTRACTION  ~ 2006    OB History    Gravida Para Term Preterm AB Living   3 3 3  0 0 3   SAB TAB Ectopic Multiple Live Births   0 0 0 0 3       Home Medications    Prior to Admission medications   Medication Sig Start Date End Date Taking? Authorizing Provider  acetaminophen (TYLENOL) 500 MG tablet Take 1,000 mg by mouth every 6 (six) hours as needed for headache.    Yes [provider]  butalbital-acetaminophen-caffeine (FIORICET, ESGIC) 50-325-40 MG tablet Take 1 tablet by mouth every 6 (six) hours as needed for headache or migraine.   Yes [provider]  calcium carbonate (TUMS - DOSED IN MG ELEMENTAL CALCIUM) 500 MG chewable tablet Chew 1-2 tablets by mouth 2 (two) times daily as needed for indigestion or heartburn.    Yes [provider]  citalopram (CELEXA) 20 MG tablet Take 20 mg by mouth daily.   Yes [provider]  ibuprofen (ADVIL,MOTRIN) 800 MG tablet Take 1 tablet (800 mg total) by mouth every 8 (eight) hours. 03/29/17  Yes Bovard-Stuckert, Jody, MD  oxyCODONE (OXY IR/ROXICODONE) 5 MG immediate release tablet 1-2 tablets q 6 hour prn/severe pain Patient taking differently: Take 5-10 mg by mouth every 6 (six) hours as needed for severe pain. 1-2 tablets q 6 hour prn/severe pain 03/29/17  Yes Bovard-Stuckert, Jody, MD  Prenatal Vit-Fe Fumarate-FA (PRENATAL MULTIVITAMIN) TABS tablet Take 1 tablet by mouth daily at 12 noon. 03/29/17  Yes Bovard-Stuckert, Jody, MD  hydrochlorothiazide  (HYDRODIURIL) 25 MG tablet Take 1 tablet (25 mg total) by mouth daily. 06/13/17   Anselm Pancoast, PA-C    Family History No family history on file.  Social History Social History  Substance Use Topics  . Smoking status: Former Smoker    Packs/day: 0.10    Years: 3.00    Types: Cigarettes    Quit date: 11/17/2010  . Smokeless tobacco: Never Used  . Alcohol use No     Allergies   Patient has no known allergies.   Review of Systems Review of Systems  Constitutional: Negative for chills and fever.  HENT: Negative for trouble swallowing.   Eyes: Positive for photophobia. Negative for visual disturbance.  Respiratory: Negative for shortness of breath.   Cardiovascular: Negative for chest pain.  Gastrointestinal: Positive for nausea and vomiting. Negative for abdominal pain.  Musculoskeletal: Negative for back pain and neck pain.  Neurological: Positive for headaches. Negative for dizziness, syncope, facial asymmetry, speech difficulty, weakness, light-headedness and numbness.  All other systems reviewed and are negative.    Physical Exam Updated Vital Signs BP (!) 213/101 (BP Location: Right Arm)   Pulse 71   Temp 98.2 F (36.8 C) (Oral)   Resp 18   Ht 5\' 1"  (1.549 m)   Wt 80.7 kg (178 lb)   SpO2 99%   BMI 33.63 kg/m   Physical Exam  Constitutional: She is oriented to person, place, and time. She appears well-developed and well-nourished. No distress.  HENT:  Head: Normocephalic and atraumatic.  Right Ear: Tympanic membrane, external ear and ear canal normal.  Left Ear: Tympanic membrane, external ear and ear canal normal.  Eyes: Pupils are equal, round, and reactive to light. Conjunctivae and EOM are normal.  Neck: Normal range of motion. Neck supple.  Cardiovascular: Normal rate, regular rhythm, normal heart sounds and intact distal pulses.   Pulmonary/Chest: Effort normal and breath sounds normal. No respiratory distress.  Abdominal: Soft. There is no tenderness.  There is no guarding.  Musculoskeletal: She exhibits no edema.  Normal motor function intact in all extremities and spine. No midline spinal tenderness.   Lymphadenopathy:    She has no cervical adenopathy.  Neurological: She is alert and oriented to person, place, and time.  No sensory deficits. Strength 5/5 in all extremities. No gait disturbance. Coordination intact including heel to shin and finger to nose. Cranial nerves III-XII grossly intact. No facial droop.   Skin: Skin is warm and dry. She is not diaphoretic.  Psychiatric: She has a normal mood and affect. Her behavior is normal.  Nursing note and vitals reviewed.    ED Treatments / Results  Labs (all labs ordered are listed, but only abnormal results are displayed) Labs Reviewed  BASIC METABOLIC PANEL - Abnormal; Notable for the following:       Result Value   Glucose, Bld 136 (*)    BUN 5 (*)    All other components within normal limits  CBC - Abnormal; Notable for the following:    WBC 12.4 (*)  Hemoglobin 11.6 (*)    MCH 25.3 (*)    All other components within normal limits  URINALYSIS, ROUTINE W REFLEX MICROSCOPIC - Abnormal; Notable for the following:    APPearance HAZY (*)    All other components within normal limits  HCG, QUANTITATIVE, PREGNANCY    EKG  EKG Interpretation None       Radiology Ct Head Wo Contrast  Result Date: 06/12/2017 CLINICAL DATA:  Headache and hypertension today. History of migraines. EXAM: CT HEAD WITHOUT CONTRAST TECHNIQUE: Contiguous axial images were obtained from the base of the skull through the vertex without intravenous contrast. COMPARISON:  03/18/2016 FINDINGS: Brain: No evidence of acute infarction, hemorrhage, hydrocephalus, extra-axial collection or mass lesion/mass effect. Vascular: No hyperdense vessel or unexpected calcification. Skull: Normal. Negative for fracture or focal lesion. Sinuses/Orbits: No acute finding. Other: None. IMPRESSION: No acute intracranial  abnormalities. Electronically Signed   By: Burman NievesWilliam  Stevens M.D.   On: 06/12/2017 23:21    Procedures Procedures (including critical care time)  Medications Ordered in ED Medications  acetaminophen (TYLENOL) tablet 1,000 mg (1,000 mg Oral Given 06/13/17 0040)  hydrALAZINE (APRESOLINE) injection 5 mg (5 mg Intravenous Given 06/13/17 0118)  metoCLOPramide (REGLAN) injection 10 mg (10 mg Intravenous Given 06/13/17 0120)  sodium chloride 0.9 % bolus 1,000 mL (1,000 mLs Intravenous New Bag/Given 06/13/17 0118)     Initial Impression / Assessment and Plan / ED Course  I have reviewed the triage vital signs and the nursing notes.  Pertinent labs & imaging results that were available during my care of the patient were reviewed by me and considered in my medical decision making (see chart for details).  Clinical Course as of Jun 13 156  Sat Jun 13, 2017  0016 Spoke with ED pharmacist regarding antiemetic choices  [SJ]  0020 Spoke with Dr. Hinton RaoBovard-Stuckert, OBGYN, who states it is too far out for preeclampsia to be a consideration. As far as medication choices go for acute blood pressure control while breast-feeding, she cannot think of any restrictions in particular with this age of infant. Recommends double checking with pharmacy. Even short courses of low dose oral medications like HCTZ are fine.  [SJ]  0112 Patient states she feels much better and her headache is well managed. She states that she thinks that the suddenness of the headache gave her some anxiety and that was part of why her pressure went as high as it did. States she would like to go home now. She agrees with the plan for close PCP vs OBGYN follow up.  [SJ]  [id number] Patient states she is pain free and ready for discharge.  [SJ]    Clinical Course User Index [SJ] Joy, Shawn C, PA-C     Patient presents with complaint of headache. Lab results encouraging. Head CT normal. The question of the need for a LP was discussed. Shared decision  making was used when this was discussed with the patient. Patient opted against this test. When we initially discussed options for management of her headache, patient was adamant about avoiding any medications that would be unsafe and breast-feeding. Pump and dump technique was discussed. Patient stood firm on avoiding medications unsafe in breastfeeding. Patient showed improvement in both her blood pressure and headache over her ED course with minimal intervention. She had no onset of additional symptoms. Able to pass an oral fluid challenge without difficulty. PCP and OB/GYN follow-up. The patient was given instructions for home care as well as return precautions. These instructions included  a short course prescription of HCTZ that can be filled should patient's high blood pressure recur before she is able to get in to see PCP or OBGYN. Patient voices understanding of these instructions, accepts the plan, and is comfortable with discharge.    Findings and plan of care discussed with Dutch Quinthris Pollina, MD.    Vitals:   06/12/17 2249 06/12/17 2330 06/13/17 0000 06/13/17 0030  BP: (!) 199/120 (!) 172/95 (!) 189/96 (!) 165/92  Pulse: (!) 56  60   Resp: 13 16 20 17   Temp:      TempSrc:      SpO2: 100%  100%   Weight:      Height:       Vitals:   06/12/17 2330 06/13/17 0000 06/13/17 0030 06/13/17 0130  BP: (!) 172/95 (!) 189/96 (!) 165/92 (!) 146/79  Pulse:  60    Resp: 16 20 17 18   Temp:      TempSrc:      SpO2:  100%    Weight:      Height:          Final Clinical Impressions(s) / ED Diagnoses   Final diagnoses:  Bad headache  Hypertension, unspecified type    New Prescriptions New Prescriptions   HYDROCHLOROTHIAZIDE (HYDRODIURIL) 25 MG TABLET    Take 1 tablet (25 mg total) by mouth daily.     Anselm PancoastJoy, Shawn C, PA-C 06/13/17 0159    Gilda CreasePollina, Christopher J, MD 06/21/17 [phone]

## 2017-06-13 MED ORDER — METOCLOPRAMIDE HCL 5 MG/ML IJ SOLN
10.0000 mg | Freq: Once | INTRAMUSCULAR | Status: AC
  Administered 2017-06-13: 10 mg via INTRAVENOUS
  Filled 2017-06-13: qty 2

## 2017-06-13 MED ORDER — ACETAMINOPHEN 500 MG PO TABS
1000.0000 mg | ORAL_TABLET | Freq: Once | ORAL | Status: AC
  Administered 2017-06-13: 1000 mg via ORAL
  Filled 2017-06-13: qty 2

## 2017-06-13 MED ORDER — SODIUM CHLORIDE 0.9 % IV BOLUS (SEPSIS)
1000.0000 mL | Freq: Once | INTRAVENOUS | Status: AC
  Administered 2017-06-13: 1000 mL via INTRAVENOUS

## 2017-06-13 MED ORDER — HYDRALAZINE HCL 20 MG/ML IJ SOLN
5.0000 mg | Freq: Once | INTRAMUSCULAR | Status: AC
  Administered 2017-06-13: 5 mg via INTRAVENOUS
  Filled 2017-06-13: qty 1

## 2017-06-13 MED ORDER — HYDROCHLOROTHIAZIDE 25 MG PO TABS: 25.0000 mg | ORAL_TABLET | Freq: Every day | ORAL | 0 refills | Status: AC

## 2017-06-13 NOTE — ED Notes (Signed)
Pt verbalized understanding discharge instructions and denies any further needs or questions at this time. VS stable, ambulatory and steady gait.   

## 2017-06-13 NOTE — Discharge Instructions (Signed)
Your labs and imaging results were encouraging today. Your blood pressure was noted to be high, but was able to be managed here in the ED. You have been given a prescription for a blood pressure medication. Should you check your blood pressure and note it to be high (higher than 140/90), you may fill this medication and begin taking it. Please note that you should not continue breast feeding while taking this medication without the approval of your OB/GYN. During this time you would need to pump and dump and then feed your child formula. Regardless, you should follow up with a primary care provider and/or OBGYN on this matter.

## 2017-12-02 ENCOUNTER — Ambulatory Visit: Payer: Self-pay | Admitting: Nurse Practitioner

## 2017-12-02 VITALS — BP 118/78 | HR 86 | Temp 98.4°F | Resp 18 | Wt 190.2 lb

## 2017-12-02 DIAGNOSIS — J209 Acute bronchitis, unspecified: Secondary | ICD-10-CM

## 2017-12-02 DIAGNOSIS — J0140 Acute pansinusitis, unspecified: Secondary | ICD-10-CM

## 2017-12-02 MED ORDER — AMOXICILLIN-POT CLAVULANATE 875-125 MG PO TABS: 1.0000 | ORAL_TABLET | Freq: Two times a day (BID) | ORAL | 0 refills | Status: AC

## 2017-12-02 MED ORDER — DEXTROMETHORPHAN HBR 15 MG/5ML PO SYRP: 5.0000 mL | ORAL_SOLUTION | Freq: Four times a day (QID) | ORAL | 0 refills | Status: DC | PRN

## 2017-12-02 MED ORDER — PREDNISONE 10 MG (21) PO TBPK: ORAL_TABLET | ORAL | 0 refills | Status: AC

## 2017-12-02 MED ORDER — DEXTROMETHORPHAN HBR 15 MG/5ML PO SYRP: 5.0000 mL | ORAL_SOLUTION | Freq: Four times a day (QID) | ORAL | 0 refills | Status: AC | PRN

## 2017-12-02 NOTE — Progress Notes (Signed)
Subjective:  Lori Roy is a 32 y.o. female who presents for evaluation of URI like symptoms.  Symptoms include nasal congestion, sinus pressure, headache, sore throat and chest congestion.  Patient states it is now difficult for her to deep breath or to catch her breath.   Onset of symptoms was 5 days ago, and has been gradually worsening since that time.  Treatment to date:  acetaminophen and decongestants.  High risk factors for influenza complications:  none. Patient does not smoke and denies smoking the home.  The following portions of the patient's history were reviewed and updated as appropriate:  allergies, current medications and past medical history.  Constitutional: positive for anorexia, chills, fatigue, fevers and sweats, negative for night sweats and weight loss Eyes: negative Ears, nose, mouth, throat, and face: positive for nasal congestion and sore throat, negative for ear drainage and hoarseness Respiratory: positive for cough and with green sputum production, negative for asthma, dyspnea on exertion, stridor and wheezing Cardiovascular: negative Neurological: positive for headaches Behavioral/Psych: negative Objective:  BP 118/78 (BP Location: Right Arm, Patient Position: Sitting, Cuff Size: Normal)   Pulse 86   Temp 98.4 F (36.9 C) (Oral)   Resp 18   Wt 190 lb 3.2 oz (86.3 kg)   SpO2 100%   BMI 35.94 kg/m  General appearance: alert, cooperative and fatigued Head: Normocephalic, without obvious abnormality, atraumatic Eyes: conjunctivae/corneas clear. PERRL, EOM's intact. Fundi benign. Ears: normal TM's and external ear canals both ears Nose: no discharge, turbinates swollen, inflamed, moderate maxillary sinus tenderness bilateral, moderate frontal sinus tenderness bilateral Throat: lips, mucosa, and tongue normal; teeth and gums normal Lungs: clear to auscultation bilaterally Heart: regular rate and rhythm, S1, S2 normal, no murmur, click, rub or  gallop Pulses: 2+ and symmetric Skin: Skin color, texture, turgor normal. No rashes or lesions Lymph nodes: cervical and submandibular nodes normal Neurologic: Grossly normal    Assessment:  sinusitis and Acute Bronchitis    Plan:  Discussed diagnosis and treatment of sinusitis. Educational material distributed and questions answered. Suggested symptomatic OTC remedies. Supportive care with appropriate antipyretics and fluids. Nasal saline spray for congestion. Augmentin per orders. Follow up as needed. Patient given prednisone taper and Tussin for cough.  Patient verbalizes understanding.

## 2017-12-02 NOTE — Patient Instructions (Addendum)
Sinusitis, Adult  Sinusitis is soreness and inflammation of your sinuses. Sinuses are hollow spaces in the bones around your face. Your sinuses are located:  Around your eyes.  In the middle of your forehead.  Behind your nose.  In your cheekbones.    Your sinuses and nasal passages are lined with a stringy fluid (mucus). Mucus normally drains out of your sinuses. When your nasal tissues become inflamed or swollen, the mucus can become trapped or blocked so air cannot flow through your sinuses. This allows bacteria, viruses, and funguses to grow, which leads to infection.  Sinusitis can develop quickly and last for 7?10 days (acute) or for more than 12 weeks (chronic). Sinusitis often develops after a cold.  What are the causes?  This condition is caused by anything that creates swelling in the sinuses or stops mucus from draining, including:  Allergies.  Asthma.  Bacterial or viral infection.  Abnormally shaped bones between the nasal passages.  Nasal growths that contain mucus (nasal polyps).  Narrow sinus openings.  Pollutants, such as chemicals or irritants in the air.  A foreign object stuck in the nose.  A fungal infection. This is rare.    What increases the risk?  The following factors may make you more likely to develop this condition:  Having allergies or asthma.  Having had a recent cold or respiratory tract infection.  Having structural deformities or blockages in your nose or sinuses.  Having a weak immune system.  Doing a lot of swimming or diving.  Overusing nasal sprays.  Smoking.    What are the signs or symptoms?  The main symptoms of this condition are pain and a feeling of pressure around the affected sinuses. Other symptoms include:  Upper toothache.  Earache.  Headache.  Bad breath.  Decreased sense of smell and taste.  A cough that may get worse at night.  Fatigue.  Fever.  Thick drainage from your nose. The drainage is often green and it may contain pus (purulent).  Stuffy nose or  congestion.  Postnasal drip. This is when extra mucus collects in the throat or back of the nose.  Swelling and warmth over the affected sinuses.  Sore throat.  Sensitivity to light.    How is this diagnosed?  This condition is diagnosed based on symptoms, a medical history, and a physical exam. To find out if your condition is acute or chronic, your health care provider may:  Look in your nose for signs of nasal polyps.  Tap over the affected sinus to check for signs of infection.  View the inside of your sinuses using an imaging device that has a light attached (endoscope).    If your health care provider suspects that you have chronic sinusitis, you may also:  Be tested for allergies.  Have a sample of mucus taken from your nose (nasal culture) and checked for bacteria.  Have a mucus sample examined to see if your sinusitis is related to an allergy.    If your sinusitis does not respond to treatment and it lasts longer than 8 weeks, you may have an MRI or CT scan to check your sinuses. These scans also help to determine how severe your infection is.  In rare cases, a bone biopsy may be done to rule out more serious types of fungal sinus disease.  How is this treated?  Treatment for sinusitis depends on the cause and whether your condition is chronic or acute. If a virus is causing   your sinusitis, your symptoms will go away on their own within 10 days. You may be given medicines to relieve your symptoms, including:  Topical nasal decongestants. They shrink swollen nasal passages and let mucus drain from your sinuses.  Antihistamines. These drugs block inflammation that is triggered by allergies. This can help to ease swelling in your nose and sinuses.  Topical nasal corticosteroids. These are nasal sprays that ease inflammation and swelling in your nose and sinuses.  Nasal saline washes. These rinses can help to get rid of thick mucus in your nose.    If your condition is caused by bacteria, you will be given an  antibiotic medicine. If your condition is caused by a fungus, you will be given an antifungal medicine.  Surgery may be needed to correct underlying conditions, such as narrow nasal passages. Surgery may also be needed to remove polyps.  Follow these instructions at home:  Medicines  Take, use, or apply over-the-counter and prescription medicines only as told by your health care provider. These may include nasal sprays.  If you were prescribed an antibiotic medicine, take it as told by your health care provider. Do not stop taking the antibiotic even if you start to feel better.  Hydrate and Humidify  Drink enough water to keep your urine clear or pale yellow. Staying hydrated will help to thin your mucus.  Use a cool mist humidifier to keep the humidity level in your home above 50%.  Inhale steam for 10-15 minutes, 3-4 times a day or as told by your health care provider. You can do this in the bathroom while a hot shower is running.  Limit your exposure to cool or dry air.  Rest  Rest as much as possible.  Sleep with your head raised (elevated).  Make sure to get enough sleep each night.  General instructions  Apply a warm, moist washcloth to your face 3-4 times a day or as told by your health care provider. This will help with discomfort.  Wash your hands often with soap and water to reduce your exposure to viruses and other germs. If soap and water are not available, use hand sanitizer.  Do not smoke. Avoid being around people who are smoking (secondhand smoke).  Keep all follow-up visits as told by your health care provider. This is important.  Contact a health care provider if:  You have a fever.  Your symptoms get worse.  Your symptoms do not improve within 10 days.  Get help right away if:  You have a severe headache.  You have persistent vomiting.  You have pain or swelling around your face or eyes.  You have vision problems.  You develop confusion.  Your neck is stiff.  You have trouble breathing.  This  information is not intended to replace advice given to you by your health care provider. Make sure you discuss any questions you have with your health care provider.  Document Released: 11/03/2005 Document Revised: 06/29/2016 Document Reviewed: 08/29/2015  Elsevier Interactive Patient Education  2018 [employer]  Acute Bronchitis, Adult  Acute bronchitis is sudden (acute) swelling of the air tubes (bronchi) in the lungs. Acute bronchitis causes these tubes to fill with mucus, which can make it hard to breathe. It can also cause coughing or wheezing.  In adults, acute bronchitis usually goes away within 2 weeks. A cough caused by bronchitis may last up to 3 weeks. Smoking, allergies, and asthma can make the condition worse. Repeated episodes of bronchitis   may cause further lung problems, such as chronic obstructive pulmonary disease (COPD).  What are the causes?  This condition can be caused by germs and by substances that irritate the lungs, including:   Cold and flu viruses. This condition is most often caused by the same virus that causes a cold.   Bacteria.   Exposure to tobacco smoke, dust, fumes, and air pollution.    What increases the risk?  This condition is more likely to develop in people who:   Have close contact with someone with acute bronchitis.   Are exposed to lung irritants, such as tobacco smoke, dust, fumes, and vapors.   Have a weak immune system.   Have a respiratory condition such as asthma.    What are the signs or symptoms?  Symptoms of this condition include:   A cough.   Coughing up clear, yellow, or green mucus.   Wheezing.   Chest congestion.   Shortness of breath.   A fever.   Body aches.   Chills.   A sore throat.    How is this diagnosed?  This condition is usually diagnosed with a physical exam. During the exam, your health care provider may order tests, such as chest X-rays, to rule out other conditions. He or she may also:   Test a sample of your mucus for  bacterial infection.   Check the level of oxygen in your blood. This is done to check for pneumonia.   Do a chest X-ray or lung function testing to rule out pneumonia and other conditions.   Perform blood tests.    Your health care provider will also ask about your symptoms and medical history.  How is this treated?  Most cases of acute bronchitis clear up over time without treatment. Your health care provider may recommend:   Drinking more fluids. Drinking more makes your mucus thinner, which may make it easier to breathe.   Taking a medicine for a fever or cough.   Taking an antibiotic medicine.   Using an inhaler to help improve shortness of breath and to control a cough.   Using a cool mist vaporizer or humidifier to make it easier to breathe.    Follow these instructions at home:  Medicines   Take over-the-counter and prescription medicines only as told by your health care provider.   If you were prescribed an antibiotic, take it as told by your health care provider. Do not stop taking the antibiotic even if you start to feel better.  General instructions   Get plenty of rest.   Drink enough fluids to keep your urine clear or pale yellow.   Avoid smoking and secondhand smoke. Exposure to cigarette smoke or irritating chemicals will make bronchitis worse. If you smoke and you need help quitting, ask your health care provider. Quitting smoking will help your lungs heal faster.   Use an inhaler, cool mist vaporizer, or humidifier as told by your health care provider.   Keep all follow-up visits as told by your health care provider. This is important.  How is this prevented?  To lower your risk of getting this condition again:   Wash your hands often with soap and water. If soap and water are not available, use hand sanitizer.   Avoid contact with people who have cold symptoms.   Try not to touch your hands to your mouth, nose, or eyes.   Make sure to get the flu shot every year.      Contact a  health care provider if:   Your symptoms do not improve in 2 weeks of treatment.  Get help right away if:   You cough up blood.   You have chest pain.   You have severe shortness of breath.   You become dehydrated.   You faint or keep feeling like you are going to faint.   You keep vomiting.   You have a severe headache.   Your fever or chills gets worse.  This information is not intended to replace advice given to you by your health care provider. Make sure you discuss any questions you have with your health care provider.  Document Released: 12/11/2004 Document Revised: 05/28/2016 Document Reviewed: 04/23/2016  Elsevier Interactive Patient Education  2018 [employer]

## 2017-12-02 NOTE — Addendum Note (Signed)
Addended by: Lieutenant DiegoHINES, Ixel Boehning A on: 12/02/2017 06:57 PM   Modules accepted: Orders

## 2017-12-04 ENCOUNTER — Telehealth: Payer: Self-pay | Admitting: Emergency Medicine

## 2018-07-01 NOTE — Telephone Encounter (Signed)
error 

## 2018-11-16 ENCOUNTER — Encounter: Payer: Self-pay | Admitting: Emergency Medicine

## 2018-11-16 ENCOUNTER — Emergency Department (HOSPITAL_COMMUNITY): Payer: Self-pay

## 2018-11-16 ENCOUNTER — Emergency Department (HOSPITAL_COMMUNITY)
Admission: EM | Admit: 2018-11-16 | Discharge: 2018-11-16 | Disposition: A | Discharge status: Home / Self Care | Payer: Self-pay | Attending: Emergency Medicine | Admitting: Emergency Medicine

## 2018-11-16 DIAGNOSIS — R0981 Nasal congestion: Secondary | ICD-10-CM | POA: Insufficient documentation

## 2018-11-16 DIAGNOSIS — R5383 Other fatigue: Secondary | ICD-10-CM | POA: Insufficient documentation

## 2018-11-16 DIAGNOSIS — F1290 Cannabis use, unspecified, uncomplicated: Secondary | ICD-10-CM | POA: Insufficient documentation

## 2018-11-16 DIAGNOSIS — R0789 Other chest pain: Secondary | ICD-10-CM | POA: Insufficient documentation

## 2018-11-16 DIAGNOSIS — Z87891 Personal history of nicotine dependence: Secondary | ICD-10-CM | POA: Insufficient documentation

## 2018-11-16 DIAGNOSIS — R1084 Generalized abdominal pain: Secondary | ICD-10-CM | POA: Insufficient documentation

## 2018-11-16 LAB — URINALYSIS, ROUTINE W REFLEX MICROSCOPIC
Bilirubin Urine: NEGATIVE
Glucose, UA: NEGATIVE mg/dL
Ketones, ur: NEGATIVE mg/dL
Leukocytes, UA: NEGATIVE
Nitrite: NEGATIVE
Protein, ur: 100 mg/dL — AB
RBC / HPF: >50 RBC/hpf — ABNORMAL HIGH (ref 0–5)
Specific Gravity, Urine: 1.026 (ref 1.005–1.030)
pH: 6 (ref 5.0–8.0)

## 2018-11-16 LAB — CBC
HCT: 40.6 % (ref 36.0–46.0)
Hemoglobin: 12.7 g/dL (ref 12.0–15.0)
MCH: 27 pg (ref 26.0–34.0)
MCHC: 31.3 g/dL (ref 30.0–36.0)
MCV: 86.2 fL (ref 80.0–100.0)
Platelets: 377 10*3/uL (ref 150–400)
RBC: 4.71 MIL/uL (ref 3.87–5.11)
RDW: 15.6 % — ABNORMAL HIGH (ref 11.5–15.5)
WBC: 10 10*3/uL (ref 4.0–10.5)
nRBC: 0 % (ref 0.0–0.2)

## 2018-11-16 LAB — COMPREHENSIVE METABOLIC PANEL
ALBUMIN: 4 g/dL (ref 3.5–5.0)
ALT: 12 U/L (ref 0–44)
AST: 17 U/L (ref 15–41)
Alkaline Phosphatase: 73 U/L (ref 38–126)
Anion gap: 10 (ref 5–15)
BUN: 5 mg/dL — AB (ref 6–20)
CO2: 25 mmol/L (ref 22–32)
Calcium: 9.4 mg/dL (ref 8.9–10.3)
Chloride: 104 mmol/L (ref 98–111)
Creatinine, Ser: 0.79 mg/dL (ref 0.44–1.00)
GFR calc Af Amer: >60 mL/min (ref >60)
GFR calc non Af Amer: >60 mL/min (ref >60)
Glucose, Bld: 114 mg/dL — ABNORMAL HIGH (ref 70–99)
Potassium: 4.1 mmol/L (ref 3.5–5.1)
Sodium: 139 mmol/L (ref 135–145)
Total Bilirubin: 0.2 mg/dL — ABNORMAL LOW (ref 0.3–1.2)
Total Protein: 7.1 g/dL (ref 6.5–8.1)

## 2018-11-16 LAB — I-STAT BETA HCG BLOOD, ED (MC, WL, AP ONLY): I-stat hCG, quantitative: <5 m[IU]/mL (ref <5)

## 2018-11-16 LAB — LIPASE, BLOOD: Lipase: 25 U/L (ref 11–51)

## 2018-11-16 LAB — D-DIMER, QUANTITATIVE (NOT AT ARMC): D DIMER QUANT: 0.55 ug{FEU}/mL — AB (ref 0.00–0.50)

## 2018-11-16 MED ORDER — IOPAMIDOL (ISOVUE-370) INJECTION 76%
INTRAVENOUS | Status: AC
  Administered 2018-11-16: 100 mL
  Filled 2018-11-16: qty 100

## 2018-11-16 NOTE — ED Triage Notes (Signed)
Pt in c/o fatigue and body aches for the last week, also having intermittent chest pain and pressure, radiates into her back, pain has gotten progressively worse, also abdominal pain with n/v

## 2018-11-16 NOTE — ED Notes (Signed)
Patient transported to CT 

## 2018-11-16 NOTE — ED Notes (Signed)
Pt stable, ambulatory, states understanding of discharge instructions 

## 2018-11-16 NOTE — ED Provider Notes (Signed)
[facility] Provider Note   CSN: [id number] Arrival date & time: 11/16/18  1450     History   Chief Complaint No chief complaint on file.   HPI Garner GavelKristin D Sabet is a 32 y.o. female.  Patient is a 32 year old female with a history of depression, anxiety and anemia who presents with various complaints.  She states that about a week and a half ago she started feeling fatigued and achy.  She has a little bit of nasal congestion and a little bit of coughing.  This was followed by diffuse body aches.  She then started having some episodes of sharp pain across her chest and into her back.  This would come and go.  She currently has some soreness in her lower chest and upper abdomen.  She did have an episode of sharp pain in her abdomen associated nausea and vomiting but she no longer has that.  She has no diarrhea.  No urinary symptoms.  No fevers.  No sore throat.  She feels like it may be related to her anxiety but the chest pain is little worse than she normally has with her anxiety.  She feels very fatigued and a little bit short of breath when she is ambulating.  She states she has been under a lot of stress recently.  She did have some dizziness and lightheadedness when she was having the severe chest pain episodes but denies any currently.     Past Medical History:  Diagnosis Date  . Anxiety   . Delivery of pregnancy by cesarean section 03/27/2017  . Depression   . Iron deficiency anemia   . Migraines   . Normal pregnancy 07/12/2011  . SROM (spontaneous rupture of membranes) 07/12/2011  . SVD (spontaneous vaginal delivery) 07/13/2011    Patient Active Problem List   Diagnosis Date Noted  . Delivery of pregnancy by cesarean section 03/27/2017  . Delayed delivery after SROM (spontaneous rupture of membranes) 03/26/2017  . Normal labor 07/31/2014  . NSVD (normal spontaneous vaginal delivery) 07/31/2014  . Cholelithiasis 05/10/2013  . SVD  (spontaneous vaginal delivery) 07/13/2011  . History of migraines     Past Surgical History:  Procedure Laterality Date  . CESAREAN SECTION WITH BILATERAL TUBAL LIGATION N/A 03/27/2017   Procedure: CESAREAN SECTION WITH BILATERAL TUBAL LIGATION;  Surgeon: Sherian ReinBovard-Stuckert, Jody, MD;  Location: [facility];  Service: Obstetrics;  Laterality: N/A;  . CHOLECYSTECTOMY N/A 05/11/2013   Procedure: LAPAROSCOPIC CHOLECYSTECTOMY WITH INTRAOPERATIVE CHOLANGIOGRAM;  Surgeon: Cherylynn RidgesJames O Wyatt, MD;  Location: [facility] OR;  Service: General;  Laterality: N/A;  . WISDOM TOOTH EXTRACTION  ~ 2006     OB History    Gravida  3   Para  3   Term  3   Preterm  0   AB  0   Living  3     SAB  0   TAB  0   Ectopic  0   Multiple  0   Live Births  3            Home Medications    Prior to Admission medications   Medication Sig Start Date End Date Taking? Authorizing Provider  calcium carbonate (TUMS - DOSED IN MG ELEMENTAL CALCIUM) 500 MG chewable tablet Chew 1-2 tablets by mouth 2 (two) times daily as needed for indigestion or heartburn.    Yes [provider]  omeprazole (PRILOSEC) 40 MG capsule Take 40 mg by mouth daily as needed (for  heartburn).   Yes [provider]  pseudoephedrine (SUDAFED) 60 MG tablet Take 60 mg by mouth every 4 (four) hours as needed for congestion.   Yes [provider]  sertraline (ZOLOFT) 100 MG tablet Take 100 mg by mouth daily.   Yes [provider]  traMADol (ULTRAM) 50 MG tablet Take 50-100 mg by mouth every 8 (eight) hours as needed for moderate pain.   Yes [provider]  hydrochlorothiazide (HYDRODIURIL) 25 MG tablet Take 1 tablet (25 mg total) by mouth daily. Patient not taking: Reported on 11/16/2018 06/13/17   Anselm Pancoast, PA-C  ibuprofen (ADVIL,MOTRIN) 800 MG tablet Take 1 tablet (800 mg total) by mouth every 8 (eight) hours. Patient not taking: Reported on 11/16/2018 03/29/17   Bovard-Stuckert, Augusto Gamble, MD    oxyCODONE (OXY IR/ROXICODONE) 5 MG immediate release tablet 1-2 tablets q 6 hour prn/severe pain Patient not taking: Reported on 11/16/2018 03/29/17   Bovard-Stuckert, Augusto Gamble, MD  Prenatal Vit-Fe Fumarate-FA (PRENATAL MULTIVITAMIN) TABS tablet Take 1 tablet by mouth daily at 12 noon. Patient not taking: Reported on 11/16/2018 03/29/17   Sherian Rein, MD    Family History History reviewed. No pertinent family history.  Social History Social History   Tobacco Use  . Smoking status: Former Smoker    Packs/day: 0.10    Years: 3.00    Pack years: 0.30    Types: Cigarettes    Last attempt to quit: 11/17/2010    Years since quitting: 8.0  . Smokeless tobacco: Never Used  Substance Use Topics  . Alcohol use: No  . Drug use: Yes    Types: Marijuana     Allergies   Patient has no known allergies.   Review of Systems Review of Systems  Constitutional: Positive for fatigue. Negative for chills, diaphoresis and fever.  HENT: Positive for congestion. Negative for rhinorrhea and sneezing.   Eyes: Negative.   Respiratory: Positive for cough and shortness of breath. Negative for chest tightness.   Cardiovascular: Positive for chest pain. Negative for leg swelling.  Gastrointestinal: Positive for abdominal pain, nausea and vomiting. Negative for blood in stool and diarrhea.  Genitourinary: Negative for difficulty urinating, flank pain, frequency and hematuria.  Musculoskeletal: Positive for back pain and myalgias. Negative for arthralgias.  Skin: Negative for rash.  Neurological: Positive for dizziness, light-headedness and headaches. Negative for speech difficulty, weakness and numbness.     Physical Exam Updated Vital Signs BP 129/90 (BP Location: Right Arm)   Pulse 90   Temp 97.9 F (36.6 C) (Oral)   Resp 18   SpO2 100%   Physical Exam Constitutional:      Appearance: She is well-developed.  HENT:     Head: Normocephalic and atraumatic.  Eyes:     Pupils: Pupils  are equal, round, and reactive to light.  Neck:     Musculoskeletal: Normal range of motion and neck supple.  Cardiovascular:     Rate and Rhythm: Normal rate and regular rhythm.     Heart sounds: Normal heart sounds.  Pulmonary:     Effort: Pulmonary effort is normal. No respiratory distress.     Breath sounds: Normal breath sounds. No wheezing or rales.  Chest:     Chest wall: No tenderness.  Abdominal:     General: Bowel sounds are normal.     Palpations: Abdomen is soft.     Tenderness: There is no abdominal tenderness. There is no guarding or rebound.  Musculoskeletal: Normal range of motion.  Comments: No edema or calf tenderness  Lymphadenopathy:     Cervical: No cervical adenopathy.  Skin:    General: Skin is warm and dry.     Findings: No rash.  Neurological:     Mental Status: She is alert and oriented to person, place, and time.      ED Treatments / Results  Labs (all labs ordered are listed, but only abnormal results are displayed) Labs Reviewed  COMPREHENSIVE METABOLIC PANEL - Abnormal; Notable for the following components:      Result Value   Glucose, Bld 114 (*)    BUN 5 (*)    Total Bilirubin 0.2 (*)    All other components within normal limits  CBC - Abnormal; Notable for the following components:   RDW 15.6 (*)    All other components within normal limits  URINALYSIS, ROUTINE W REFLEX MICROSCOPIC - Abnormal; Notable for the following components:   Color, Urine AMBER (*)    APPearance HAZY (*)    Hgb urine dipstick LARGE (*)    Protein, ur 100 (*)    RBC / HPF >50 (*)    Bacteria, UA MANY (*)    All other components within normal limits  LIPASE, BLOOD  D-DIMER, QUANTITATIVE (NOT AT [facility])  I-STAT BETA HCG BLOOD, ED ([facility], [facility], [facility] ONLY)    EKG EKG Interpretation  Date/Time:  Tuesday November 16 2018 15:02:37 EST Ventricular Rate:  109 PR Interval:  112 QRS Duration: 88 QT Interval:  346 QTC Calculation: 465 R Axis:   68 Text  Interpretation:  Sinus tachycardia Cannot rule out Inferior infarct , age undetermined Anterior infarct , age undetermined Abnormal ECG SINCE LAST TRACING HEART RATE HAS INCREASED Confirmed by Rolan BuccoBelfi, Quanasia Defino [phone] on 11/16/2018 5:54:08 PM   Radiology No results found.  Procedures Procedures (including critical care time)  Medications Ordered in ED Medications - No data to display   Initial Impression / Assessment and Plan / ED Course  I have reviewed the triage vital signs and the nursing notes.  Pertinent labs & imaging results that were available during my care of the patient were reviewed by me and considered in my medical decision making (see chart for details).     Patient presents with 1.5-week history of various complaints including myalgias, fatigue, shortness of breath, chest pain abdominal pain.  She no longer has any significant chest or abdominal pain although she has some mild tenderness on palpation of her sternum and epigastrium.  Chest x-ray is clear without suggestions of pneumonia or pneumothorax.  Her labs are non-concerning.  Given her shortness of breath, history of smoking and chest pain, d-dimer was ordered which is positive.  Ct angio pending.  If negative, patient can be discharged home with symptomatic treatment.  Britni Henderly to take over care pending CT.  Final Clinical Impressions(s) / ED Diagnoses   Final diagnoses:  None    ED Discharge Orders    None       Rolan BuccoBelfi, Bernadett Milian, MD 11/16/18 1904

## 2018-11-16 NOTE — Discharge Instructions (Signed)
Your evaluated today in the emergency department.  Your imaging as well as lab work was negative.  I have referred you to primary care provider.  Please follow-up for reevaluation.  Return to the ED for any new or worsening symptoms.

## 2018-11-16 NOTE — ED Provider Notes (Signed)
Care transferred from my attending Dr. Fredderick PhenixBelfi at shift transfer. See note for detailed HPI.  In summation, 32 year old female past medical history significant for depression, anxiety, anemia who presents for evaluation of multiple complaints.  States a week and half ago she had increased fatigue well as upper respiratory symptoms.  She started having sharp chest pains at that time which were intermittent in nature.  States she has had some soreness in her upper abdomen as well as her chest.  Has had some mild nausea as well as nonbloody, nonbilious emesis.  Last episode 3 days ago.  Denies fever, chills, dysuria, sore throat, diarrhea, constipation, pelvic pain.  Patient states she does have a history of anxiety which states this feels worse than it normally has.  Patient admits to recent increase in stress.  Care transfer patient had pending CT to r/o PE.  If negative plan for DC home with follow-up with PCP for reevaluation.  Physical Exam  BP 125/83 (BP Location: Right Arm)   Pulse 87   Temp 98.1 F (36.7 C) (Oral)   Resp 17   SpO2 100%   Physical Exam Vitals signs and nursing note reviewed.  Constitutional:      General: She is not in acute distress.    Appearance: She is well-developed. She is not ill-appearing or toxic-appearing.  HENT:     Head: Normocephalic and atraumatic.     Right Ear: Tympanic membrane, ear canal and external ear normal. There is no impacted cerumen.     Left Ear: Tympanic membrane, ear canal and external ear normal. There is no impacted cerumen.     Nose: Nose normal. No congestion or rhinorrhea.     Mouth/Throat:     Mouth: Mucous membranes are moist.     Pharynx: No oropharyngeal exudate or posterior oropharyngeal erythema.     Comments: Posterior oropharynx without erythema or exudate.  Tonsils without edema or exudate. Eyes:     Pupils: Pupils are equal, round, and reactive to light.  Neck:     Musculoskeletal: Normal range of motion.  Cardiovascular:      Rate and Rhythm: Normal rate.     Pulses: Normal pulses.     Heart sounds: Normal heart sounds. No murmur. No friction rub. No gallop.   Pulmonary:     Effort: No respiratory distress.     Comments: Clear to auscultation bilaterally without wheeze, rales or rhonchi.  Will speak in full sentences without difficulty.  No evidence of acute respiratory distress. Abdominal:     General: There is no distension.     Comments: Abdomen soft, nontender without rebound or guarding.  Musculoskeletal: Normal range of motion.     Comments: Moves all extremities without difficulty.  Skin:    General: Skin is warm and dry.  Neurological:     Mental Status: She is alert.     ED Course/Procedures     Procedures Dg Chest 2 View  Result Date: 11/16/2018 CLINICAL DATA:  Shortness of breath EXAM: CHEST - 2 VIEW COMPARISON:  05/10/2013 FINDINGS: The heart size and mediastinal contours are within normal limits. Both lungs are clear. The visualized skeletal structures are unremarkable. IMPRESSION: No active cardiopulmonary disease. Electronically Signed   By: Jasmine PangKim  Fujinaga M.D.   On: 11/16/2018 18:48   Ct Angio Chest Pe W/cm &/or Wo Cm  Result Date: 11/16/2018 CLINICAL DATA:  32 year old female with history of fatigue and body aches for the past week. Intermittent chest pain and pressure. Abdominal pain  with nausea and vomiting. EXAM: CT ANGIOGRAPHY CHEST WITH CONTRAST TECHNIQUE: Multidetector CT imaging of the chest was performed using the standard protocol during bolus administration of intravenous contrast. Multiplanar CT image reconstructions and MIPs were obtained to evaluate the vascular anatomy. CONTRAST:  ISOVUE-370 IOPAMIDOL (ISOVUE-370) INJECTION 76% COMPARISON:  No priors. FINDINGS: Cardiovascular: No filling defects in the pulmonary arterial tree to suggest underlying pulmonary embolism. Heart size is borderline enlarged. There is no significant pericardial fluid, thickening or  pericardial calcification. No atherosclerotic calcifications in the thoracic aorta or the coronary arteries. Mediastinum/Nodes: No pathologically enlarged mediastinal or hilar lymph nodes. Esophagus is unremarkable in appearance. No axillary lymphadenopathy. Lungs/Pleura: No suspicious appearing pulmonary nodules or masses are noted. No acute consolidative airspace disease. No pleural effusions. Upper Abdomen: Unremarkable. Musculoskeletal: There are no aggressive appearing lytic or blastic lesions noted in the visualized portions of the skeleton. Review of the MIP images confirms the above findings. IMPRESSION: 1. No evidence of pulmonary embolism. 2. No acute findings are noted in the thorax to account for the patient's symptoms. Electronically Signed   By: Trudie Reed M.D.   On: 11/16/2018 20:19   Labs Reviewed  COMPREHENSIVE METABOLIC PANEL - Abnormal; Notable for the following components:      Result Value   Glucose, Bld 114 (*)    BUN 5 (*)    Total Bilirubin 0.2 (*)    All other components within normal limits  CBC - Abnormal; Notable for the following components:   RDW 15.6 (*)    All other components within normal limits  URINALYSIS, ROUTINE W REFLEX MICROSCOPIC - Abnormal; Notable for the following components:   Color, Urine AMBER (*)    APPearance HAZY (*)    Hgb urine dipstick LARGE (*)    Protein, ur 100 (*)    RBC / HPF >50 (*)    Bacteria, UA MANY (*)    All other components within normal limits  D-DIMER, QUANTITATIVE (NOT AT [facility]) - Abnormal; Notable for the following components:   D-Dimer, Quant 0.55 (*)    All other components within normal limits  LIPASE, BLOOD  I-STAT BETA HCG BLOOD, ED ([facility], [facility], [facility] ONLY)   MDM   32 year old female who appears otherwise well presents for evaluation of multiple complaints.  Patient with intermittent fatigue, shortness of breath, chest pain, abdominal pain as well as myalgias.  Patient denies current chest or abdominal pain.  Chest  x-ray negative.  Labs reassuring.  No lower extremity edema, erythema or tenderness.  D-dimer was ordered by previous provider which is positive.  Patient with CT angio pending to r/o PE.  Afebrile, nonseptic, non-ill-appearing.  EKG without any ischemic changes.  Low suspicion for ACS, PE, dissection causing patient's chest pain at this time.  Heart score 0.  CT negative for pulmonary embolism.  Patient is hemodynamically stable and appropriate for DC home at this time.  She has no complaints on my reevaluation.  Lungs clear to auscultation bilaterally.  She denies any current chest pain or abdominal pain.  Abdomen soft, nontender without rebound or guarding. Patient is nontoxic, nonseptic appearing, in no apparent distress.  Labs, imaging and vitals reviewed.  Patient does not meet the SIRS or Sepsis criteria.  On repeat exam patient does not have a surgical abdomin and there are no peritoneal signs.  No indication of appendicitis, bowel obstruction, bowel perforation, cholecystitis, diverticulitis, PID or ectopic pregnancy.  Patient has been able to tolerate p.o. intake and department without difficulty.  Discussed with patient follow-up with PCP for reevaluation.  Low suspicion for emergent pathology causing patient's symptoms at this time.  Discussed strict return precautions.  Patient voiced understanding and is agreeable for follow-up.      Eldena Dede A, PA-C 11/16/18 2116    Rolan BuccoBelfi, Melanie, MD 11/17/18 1100

## 2018-12-01 NOTE — Progress Notes (Signed)
Patient ID: Lori Roy, female   DOB: [birth year], 33 y.o.   MRN: [id number]      Lori Roy, is a 33 y.o. female  RVI:[id number]  MDY:[id number]  DOB - 1985-12-17  Subjective:  Chief Complaint and HPI: Lori Roy is a 33 y.o. female here today to establish care and for a follow up visit After being seen in the ED 11/16/2018 for CP/SOB/URI and multiple other s/sx.  She presents today having the same symptoms.  This has been going on since early December.  Pain in mid chest with SOB.  At times she feels light-headed and gets migraines.  Previously on tramadol and on anxiety meds but her MD made her chooses one or the other bc they were controlled substances.  Her BP is intermittently high.  Previously took HCTZ "prn." Former smoker.  Pain occurs intermittently mid-chest, esp when she feels upset.  Sometimes she sweats.  The symptoms were worst on 12/23 prior to work-up/ED visit, but she has still had them though not as severely since.  Pain sometimes radiates to back.  Alleviated with Tums/Zantac at times.    From ED notes: In summation, 33 year old female past medical history significant for depression, anxiety, anemia who presents for evaluation of multiple complaints.  States a week and half ago she had increased fatigue well as upper respiratory symptoms.  She started having sharp chest pains at that time which were intermittent in nature.  States she has had some soreness in her upper abdomen as well as her chest.  Has had some mild nausea as well as nonbloody, nonbilious emesis.  Last episode 3 days ago.  Denies fever, chills, dysuria, sore throat, diarrhea, constipation, pelvic pain.  Patient states she does have a history of anxiety which states this feels worse than it normally has.  Patient admits to recent increase in stress.  CT to r/o PE.  If negative plan for DC home with follow-up with PCP for reevaluation.  From ED A/P: 33 year old female who appears otherwise well  presents for evaluation of multiple complaints.  Patient with intermittent fatigue, shortness of breath, chest pain, abdominal pain as well as myalgias.  Patient denies current chest or abdominal pain.  Chest x-ray negative.  Labs reassuring.  No lower extremity edema, erythema or tenderness.  D-dimer was ordered by previous provider which is positive.  Patient with CT angio pending to r/o PE.  Afebrile, nonseptic, non-ill-appearing.  EKG without any ischemic changes.  Low suspicion for ACS, PE, dissection causing patient's chest pain at this time.  Heart score 0.  CT negative for pulmonary embolism.  Patient is hemodynamically stable and appropriate for DC home at this time.  She has no complaints on my reevaluation.  Lungs clear to auscultation bilaterally.  She denies any current chest pain or abdominal pain.  Abdomen soft, nontender without rebound or guarding. Patient is nontoxic, nonseptic appearing, in no apparent distress.  Labs, imaging and vitals reviewed.  Patient does not meet the SIRS or Sepsis criteria.  On repeat exam patient does not have a surgical abdomin and there are no peritoneal signs.  No indication of appendicitis, bowel obstruction, bowel perforation, cholecystitis, diverticulitis, PID or ectopic pregnancy.  Patient has been able to tolerate p.o. intake and department without difficulty.  Discussed with patient follow-up with PCP for reevaluation.  Low suspicion for emergent pathology causing patient's symptoms at this time.        ED/Hospital notes reviewed.   Social History:  Married, 3 kids  under 7yo, lots of stress esp this past year Family history:  Maternal gf with MI in 6850's with quadruple bypass.  Paternal uncle with stent.  Lots of anxiety and depression in family.  ROS:   Constitutional:  No f/c, No night sweats, No unexplained weight loss. EENT:  No vision changes, No blurry vision, No hearing changes. No mouth, throat, or ear problems.  Respiratory: No cough, +  SOB Cardiac: + CP, + palpitations GI:  + abd pain, No N/V/D. GU: No Urinary s/sx Musculoskeletal: No joint pain Neuro: No headache, no dizziness, no motor weakness.  Skin: No rash Endocrine:  No polydipsia. No polyuria.  Psych: Denies SI/HI  No problems updated.  ALLERGIES: No Known Allergies  PAST MEDICAL HISTORY: Past Medical History:  Diagnosis Date  . Anxiety   . Delivery of pregnancy by cesarean section 03/27/2017  . Depression   . Iron deficiency anemia   . Migraines   . Normal pregnancy 07/12/2011  . SROM (spontaneous rupture of membranes) 07/12/2011  . SVD (spontaneous vaginal delivery) 07/13/2011    MEDICATIONS AT HOME: Prior to Admission medications   Medication Sig Start Date End Date Taking? Authorizing Provider  sertraline (ZOLOFT) 100 MG tablet Take 100 mg by mouth daily.   Yes [provider]  busPIRone (BUSPAR) 10 MG tablet Take 1 tablet (10 mg total) by mouth 2 (two) times daily. 12/02/18   Anders SimmondsMcClung, Bryar Rennie M, PA-C  calcium carbonate (TUMS - DOSED IN MG ELEMENTAL CALCIUM) 500 MG chewable tablet Chew 1-2 tablets by mouth 2 (two) times daily as needed for indigestion or heartburn.     [provider]  hydrochlorothiazide (HYDRODIURIL) 25 MG tablet Take 1 tablet (25 mg total) by mouth daily. Patient not taking: Reported on 11/16/2018 06/13/17   Anselm PancoastJoy, Shawn C, PA-C  ibuprofen (ADVIL,MOTRIN) 800 MG tablet Take 1 tablet (800 mg total) by mouth every 8 (eight) hours. Patient not taking: Reported on 11/16/2018 03/29/17   Bovard-Stuckert, Augusto GambleJody, MD  methocarbamol (ROBAXIN) 500 MG tablet Take 2 tablets (1,000 mg total) by mouth 3 (three) times daily. Prn muscle spasm 12/02/18   Georgian CoMcClung, Jalacia Mattila M, PA-C  omeprazole (PRILOSEC) 40 MG capsule Take 40 mg by mouth daily as needed (for heartburn).    [provider]  Prenatal Vit-Fe Fumarate-FA (PRENATAL MULTIVITAMIN) TABS tablet Take 1 tablet by mouth daily at 12 noon. Patient not taking: Reported on  11/16/2018 03/29/17   Bovard-Stuckert, Augusto GambleJody, MD     Objective:  EXAM:   Vitals:   12/02/18 0903  BP: (!) 150/102  Pulse: 87  Temp: 98.3 F (36.8 C)  TempSrc: Oral  SpO2: 99%  Weight: 169 lb 6.4 oz (76.8 kg)    General appearance : A&OX3. NAD. Non-toxic-appearing HEENT: Atraumatic and Normocephalic.  PERRLA. EOM intact.   Neck: supple, no JVD. No cervical lymphadenopathy. No thyromegaly Chest/Lungs:  Breathing-non-labored, Good air entry bilaterally, breath sounds normal without rales, rhonchi, or wheezing  CVS: S1 S2 regular, no murmurs, gallops, rubs  Abdomen: Bowel sounds present, Non tender and not distended with no gaurding, rigidity or rebound. Extremities: Bilateral Lower Ext shows no edema, both legs are warm to touch with = pulse throughout Neurology:  CN II-XII grossly intact, Non focal.   Psych:  TP linear. J/I fair. Pressured speech. Appropriate eye contact and anxious affect.  Skin:  No Rash  Data Review No results found for: HGBA1C   Assessment & Plan   1. Anxiety  - Vitamin D, 25-hydroxy - Thyroid Panel With  TSH - CBC with Differential/Platelet - busPIRone (BUSPAR) 10 MG tablet; Take 1 tablet (10 mg total) by mouth 2 (two) times daily.  Dispense: 60 tablet; Refill: 2  2. Chest pain, unspecified type Believe stress/musculoskeletal - Comprehensive metabolic panel - Thyroid Panel With TSH - CBC with Differential/Platelet - methocarbamol (ROBAXIN) 500 MG tablet; Take 2 tablets (1,000 mg total) by mouth 3 (three) times daily. Prn muscle spasm  Dispense: 90 tablet; Refill: 0  3. Panic attack I had her meet with LSCW Jenel Lucks - Vitamin D, 25-hydroxy  4. Gastroesophageal reflux disease without esophagitis - H. pylori breath test  5. Encounter for examination following treatment at hospital  6.  Elevated BP w/o dx htn-check BP 3-5 times/week OOO and record and bring to next visit No change  Spent >30 mins face taking history and on counseling for  self-care, stress management, etc.    Patient have been counseled extensively about nutrition and exercise  Return in about 1 month (around 01/02/2019) for assign PCP:  f/up anxiety/muscle spasms/gerd.  The patient was given clear instructions to go to ER or return to medical center if symptoms don't improve, worsen or new problems develop. The patient verbalized understanding. The patient was told to call to get lab results if they haven't heard anything in the next week.     Georgian Co, PA-C [facility] and [facility] [place], [place] [phone]   12/02/2018, 9:34 AM

## 2018-12-02 ENCOUNTER — Ambulatory Visit: Payer: Self-pay | Attending: Family Medicine | Admitting: Licensed Clinical Social Worker

## 2018-12-02 ENCOUNTER — Ambulatory Visit: Payer: Self-pay | Attending: Family Medicine | Admitting: Physician Assistant

## 2018-12-02 VITALS — BP 150/102 | HR 87 | Temp 98.3°F | Wt 169.4 lb

## 2018-12-02 DIAGNOSIS — F419 Anxiety disorder, unspecified: Secondary | ICD-10-CM

## 2018-12-02 DIAGNOSIS — F331 Major depressive disorder, recurrent, moderate: Secondary | ICD-10-CM

## 2018-12-02 DIAGNOSIS — R030 Elevated blood-pressure reading, without diagnosis of hypertension: Secondary | ICD-10-CM

## 2018-12-02 DIAGNOSIS — R079 Chest pain, unspecified: Secondary | ICD-10-CM

## 2018-12-02 DIAGNOSIS — F410 Panic disorder [episodic paroxysmal anxiety] without agoraphobia: Secondary | ICD-10-CM

## 2018-12-02 DIAGNOSIS — K219 Gastro-esophageal reflux disease without esophagitis: Secondary | ICD-10-CM

## 2018-12-02 DIAGNOSIS — Z09 Encounter for follow-up examination after completed treatment for conditions other than malignant neoplasm: Secondary | ICD-10-CM

## 2018-12-02 MED ORDER — METHOCARBAMOL 500 MG PO TABS: 1000.0000 mg | ORAL_TABLET | Freq: Three times a day (TID) | ORAL | 0 refills | Status: DC

## 2018-12-02 MED ORDER — BUSPIRONE HCL 10 MG PO TABS: 10.0000 mg | ORAL_TABLET | Freq: Two times a day (BID) | ORAL | 2 refills | Status: AC

## 2018-12-02 MED FILL — busPIRone HCL 10 MG TABS: 10 | 30 days supply | Qty: 60 | Fill #0

## 2018-12-02 MED FILL — METHOCARBAMOL 500 MG TABS: 500 | 15 days supply | Qty: 90 | Fill #0

## 2018-12-02 NOTE — Patient Instructions (Addendum)
Check your blood pressure at least 3 times a week and record and bring to next visit so we can determine if you need to be on medications.      Stress Stress is a normal reaction to life events. Stress is what you feel when life demands more than you are used to, or more than you think you can handle. Some stress can be useful, such as studying for a test or meeting a deadline at work. Stress that occurs too often or for too long can cause problems. It can affect your emotional health and interfere with relationships and normal daily activities. Too much stress can weaken your body's defense system (immune system) and increase your risk for physical illness. If you already have a medical problem, stress can make it worse. What are the causes? All sorts of life events can cause stress. An event that causes stress for one person may not be stressful for another person. Major life events, whether positive or negative, commonly cause stress. Examples include:  Losing a job or starting a new job.  Losing a loved one.  Moving to a new town or home.  Getting married or divorced.  Having a baby.  Injury or illness. Less obvious life events can also cause stress, especially if they occur day after day or in combination with each other. Examples include:  Working long hours.  Driving in traffic.  Caring for children.  Being in debt.  Being in a difficult relationship. What are the signs or symptoms? Stress can cause emotional symptoms, including:  Anxiety. This is feeling worried, afraid, on edge, overwhelmed, or out of control.  Anger, including irritation or impatience.  Depression. This is feeling sad, down, helpless, or guilty.  Trouble focusing, remembering, or making decisions. Stress can cause physical symptoms, including:  Aches and pains. These may affect your head, neck, back, stomach, or other areas of your body.  Tight muscles or a clenched jaw.  Low  energy.  Trouble sleeping. Stress can cause unhealthy behaviors, including:  Eating to feel better (overeating) or skipping meals.  Working too much or putting off tasks.  Smoking, drinking alcohol, or using drugs to feel better. How is this diagnosed? Stress is diagnosed through an assessment by your health care provider. He or she may diagnose this condition based on:  Your symptoms and any stressful life events.  Your medical history.  Tests to rule out other causes of your symptoms. Depending on your condition, your health care provider may refer you to a specialist for further evaluation. How is this treated?  Stress management techniques are the recommended treatment for stress. Medicine is not typically recommended for the treatment of stress. Techniques to reduce your reaction to stressful life events include:  Stress identification. Monitor yourself for symptoms of stress and identify what causes stress for you. These skills may help you to avoid or prepare for stressful events.  Time management. Set your priorities, keep a calendar of events, and learn to say "no." Taking these actions can help you avoid making too many commitments. Techniques for coping with stress include:  Rethinking the problem. Try to think realistically about stressful events rather than ignoring them or overreacting. Try to find the positives in a stressful situation rather than focusing on the negatives.  Exercise. Physical exercise can release both physical and emotional tension. The key is to find a form of exercise that you enjoy and do it regularly.  Relaxation techniques. These relax the body  and mind. The key is to find one or more that you enjoy and use the technique(s) regularly. Examples include: ? Meditation, deep breathing, or progressive relaxation techniques. ? Yoga or tai chi. ? Biofeedback, mindfulness techniques, or journaling. ? Listening to music, being out in nature, or  participating in other hobbies.  Practicing a healthy lifestyle. Eat a balanced diet, drink plenty of water, limit or avoid caffeine, and get plenty of sleep.  Having a strong support network. Spend time with family, friends, or other people you enjoy being around. Express your feelings and talk things over with someone you trust. Counseling or talk therapy with a mental health professional may be helpful if you are having trouble managing stress on your own. Follow these instructions at home: Lifestyle   Avoid drugs.  Do not use any products that contain nicotine or tobacco, such as cigarettes and e-cigarettes. If you need help quitting, ask your health care provider.  Limit alcohol intake to no more than 1 drink a day for nonpregnant women and 2 drinks a day for men. One drink equals 12 oz of beer, 5 oz of wine, or 1 oz of hard liquor.  Do not use alcohol or drugs to relax.  Eat a balanced diet that includes fresh fruits and vegetables, whole grains, lean meats, fish, eggs, and beans, and low-fat dairy. Avoid processed foods and foods high in added fat, sugar, and salt.  Exercise at least 30 minutes on 5 or more days each week.  Get 7-8 hours of sleep each night. General instructions   Practice stress management techniques as discussed with your health care provider.  Drink enough fluid to keep your urine clear or pale yellow.  Take over-the-counter and prescription medicines only as told by your health care provider.  Keep all follow-up visits as told by your health care provider. This is important. Contact a health care provider if:  Your symptoms get worse.  You have new symptoms.  You feel overwhelmed by your problems and can no longer manage them on your own. Get help right away if:  You have thoughts of hurting yourself or others. If you ever feel like you may hurt yourself or others, or have thoughts about taking your own life, get help right away. You can go to  your nearest emergency department or call:  Your local emergency services (911 in the [place]  A suicide crisis helpline, such as the [place] at 5596654184. This is open 24 hours a day. Summary  Stress is a normal reaction to life events. It can cause problems if it happens too often or for too long.  Practicing stress management techniques is the best way to treat stress.  Counseling or talk therapy with a mental health professional may be helpful if you are having trouble managing stress on your own. This information is not intended to replace advice given to you by your health care provider. Make sure you discuss any questions you have with your health care provider. Document Released: 04/29/2001 Document Revised: 12/24/2016 Document Reviewed: 12/24/2016 Elsevier Interactive Patient Education  2019 [employer].

## 2018-12-03 ENCOUNTER — Other Ambulatory Visit: Payer: Self-pay | Admitting: Physician Assistant

## 2018-12-03 ENCOUNTER — Telehealth: Payer: Self-pay

## 2018-12-03 LAB — CBC WITH DIFFERENTIAL/PLATELET
Basophils Absolute: 0 10*3/uL (ref 0.0–0.2)
Basos: 1 %
EOS (ABSOLUTE): 0.2 10*3/uL (ref 0.0–0.4)
Eos: 3 %
HEMOGLOBIN: 11.4 g/dL (ref 11.1–15.9)
Hematocrit: 35.1 % (ref 34.0–46.6)
Immature Grans (Abs): 0 10*3/uL (ref 0.0–0.1)
Immature Granulocytes: 0 %
Lymphocytes Absolute: 2.6 10*3/uL (ref 0.7–3.1)
Lymphs: 33 %
MCH: 26.5 pg — ABNORMAL LOW (ref 26.6–33.0)
MCHC: 32.5 g/dL (ref 31.5–35.7)
MCV: 82 fL (ref 79–97)
Monocytes Absolute: 0.3 10*3/uL (ref 0.1–0.9)
Monocytes: 4 %
Neutrophils Absolute: 4.6 10*3/uL (ref 1.4–7.0)
Neutrophils: 59 %
Platelets: 325 10*3/uL (ref 150–450)
RBC: 4.3 x10E6/uL (ref 3.77–5.28)
RDW: 15.1 % (ref 11.7–15.4)
WBC: 7.8 10*3/uL (ref 3.4–10.8)

## 2018-12-03 LAB — THYROID PANEL WITH TSH
Free Thyroxine Index: 1.7 (ref 1.2–4.9)
T3 Uptake Ratio: 26 % (ref 24–39)
T4, Total: 6.5 ug/dL (ref 4.5–12.0)
TSH: 2.96 u[IU]/mL (ref 0.450–4.500)

## 2018-12-03 LAB — COMPREHENSIVE METABOLIC PANEL
ALBUMIN: 4.3 g/dL (ref 3.5–5.5)
ALT: 9 IU/L (ref 0–32)
AST: 8 IU/L (ref 0–40)
Albumin/Globulin Ratio: 1.7 (ref 1.2–2.2)
Alkaline Phosphatase: 83 IU/L (ref 39–117)
BUN / CREAT RATIO: 12 (ref 9–23)
BUN: 7 mg/dL (ref 6–20)
Bilirubin Total: 0.4 mg/dL (ref 0.0–1.2)
CO2: 18 mmol/L — ABNORMAL LOW (ref 20–29)
Calcium: 9.4 mg/dL (ref 8.7–10.2)
Chloride: 109 mmol/L — ABNORMAL HIGH (ref 96–106)
Creatinine, Ser: 0.59 mg/dL (ref 0.57–1.00)
GFR calc Af Amer: 141 mL/min/{1.73_m2} (ref >59)
GFR calc non Af Amer: 123 mL/min/{1.73_m2} (ref >59)
Globulin, Total: 2.5 g/dL (ref 1.5–4.5)
Glucose: 72 mg/dL (ref 65–99)
Potassium: 4.4 mmol/L (ref 3.5–5.2)
Sodium: 146 mmol/L — ABNORMAL HIGH (ref 134–144)
Total Protein: 6.8 g/dL (ref 6.0–8.5)

## 2018-12-03 LAB — VITAMIN D 25 HYDROXY (VIT D DEFICIENCY, FRACTURES): Vit D, 25-Hydroxy: 11.1 ng/mL — ABNORMAL LOW (ref 30.0–100.0)

## 2018-12-03 MED ORDER — VITAMIN D (ERGOCALCIFEROL) 1.25 MG (50000 UNIT) PO CAPS: 50000.0000 [IU] | ORAL_CAPSULE | ORAL | 0 refills | Status: DC

## 2018-12-03 NOTE — Telephone Encounter (Signed)
Patient was called and informed of lab results. 

## 2018-12-03 NOTE — BH Specialist Note (Signed)
Integrated Behavioral Health Initial Visit  MRN: [id number] Name: Lori Roy  Number of Integrated Behavioral Health Clinician visits:: 1/6 Session Start time: 9:55 AM  Session End time: 10:35 AM Total time: 40 minutes  Type of Service: Integrated Behavioral Health- Individual/Family Interpretor:No. Interpretor Name and Language: NA   Warm Hand Off Completed.       SUBJECTIVE: Lori Roy is a 33 y.o. female accompanied by self Patient was referred by Trena Platt for depression and anxiety. Patient reports the following symptoms/concerns: Pt reports an increase in psychosocial stressors resulting in increase in panic attacks Duration of problem: Ongoing, Pt has been diagnosed with depression and anxiety as a teenager; Severity of problem: severe  OBJECTIVE: Mood: Anxious and Affect: Appropriate Risk of harm to self or others: No plan to harm self or others Pt scored positive on phq-9; however, currently denies SI/HI. Protective factors identified, safety plan discussed, and crisis intervention resources provided  LIFE CONTEXT: Family and Social: Pt receives strong support from spouse and best friend. She has three minor children School/Work: Pt receives food stamps. 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 Reduce symptoms of: anxiety, depression and stress 2. Increase knowledge and/or ability of: coping skills, healthy habits and self-management skills  3. Demonstrate ability to: Increase adequate support systems for patient/family and Increase motivation to adhere to plan of care  INTERVENTIONS: Interventions utilized: Mindfulness or Management consultant, Supportive Counseling and Link to [facility]  Standardized  Assessments completed: GAD-7 and PHQ 2&9 with C-SSRS  ASSESSMENT: Patient currently experiencing depression and anxiety. 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 She receives strong support from spouse and best friend. Pt scored positive on phq-9; however, currently denies SI/HI. Protective factors identified, safety plan discussed, and crisis intervention resources provided.   Patient may benefit from psychotherapy. She has good insight on the correlation between one's physical and mental health. Therapeutic interventions were discussed and pt was strongly encouraged to initiate self-care plan to assist with the decrease and/or management of symptoms. LCSWA informed pt on the benefits of applying for financial counseling to assist with medical coverage. Home owner resources were also provided.   PLAN: 1. Follow up with behavioral health clinician on : Pt was encouraged to contact LCSWA if symptoms worsen or fail to improve to schedule behavioral appointments at [facility]. 2. Behavioral recommendations: LCSWA recommends that pt apply healthy coping skills discussed, comply with medication management, and utilize provided resources. Pt is encouraged to schedule follow up appointment with LCSWA 3. Referral(s): Integrated Art gallery manager (In Clinic) and [employer]:  Finances 4. "From scale of 1-10, how likely are you to follow plan?":   Lori Larsson, LCSW 12/06/2018 3:33 PM

## 2018-12-03 NOTE — Telephone Encounter (Signed)
-----   Message from Anders Simmonds, [place] sent at 12/03/2018  8:56 AM EST ----- Please call patient and let them that their vitamin D is VERY low.  This can contribute to muscle aches, anxiety, fatigue, and depression.  I have sent a prescription to the pharmacy for them to take once a week.  We will recheck this level in 3-4 months.  Her other labs are normal.  Still awaiting H.Pylori testing.  Follow-up as planned.  Thanks, Georgian Co, PA-C

## 2018-12-04 ENCOUNTER — Encounter: Payer: Self-pay | Admitting: Physician Assistant

## 2018-12-04 LAB — H. PYLORI BREATH TEST: H pylori Breath Test: NEGATIVE

## 2018-12-07 ENCOUNTER — Telehealth: Payer: Self-pay | Admitting: *Deleted

## 2018-12-07 MED ORDER — VITAMIN D (ERGOCALCIFEROL) 1.25 MG (50000 UNIT) PO CAPS: 50000.0000 [IU] | ORAL_CAPSULE | ORAL | 0 refills | Status: AC

## 2018-12-07 NOTE — Telephone Encounter (Signed)
Patient verified DOB Patient is aware of h pylori being negative. Patient requested vitamin d be sent to [facility] on [address]. No further questions.

## 2018-12-07 NOTE — Telephone Encounter (Signed)
-----   Message from Anders Simmonds, [place] sent at 12/06/2018  9:31 AM EST ----- Your test for stomach ulcers was negative.  Follow-up as planned.  Thanks, Georgian Co, PA-C

## 2019-01-04 ENCOUNTER — Ambulatory Visit: Payer: Self-pay | Admitting: Internal Medicine

## 2019-01-07 ENCOUNTER — Telehealth: Payer: Self-pay | Admitting: *Deleted

## 2019-01-07 NOTE — Telephone Encounter (Signed)
Medical Assistant left message on patient's home and cell voicemail. Voicemail states to give a call back to [place] with [facility] at [phone]. Patient no showed her 01/04/2019 appointment. Please document the reason for the no show and offer another appointment.

## 2019-08-24 ENCOUNTER — Encounter (HOSPITAL_COMMUNITY): Payer: Self-pay | Admitting: Emergency Medicine

## 2019-08-24 ENCOUNTER — Other Ambulatory Visit: Payer: Self-pay

## 2019-08-24 ENCOUNTER — Emergency Department (HOSPITAL_COMMUNITY)
Admission: EM | Admit: 2019-08-24 | Discharge: 2019-08-25 | Disposition: A | Discharge status: Home / Self Care | Payer: 59 | Attending: Emergency Medicine | Admitting: Emergency Medicine

## 2019-08-24 DIAGNOSIS — R1013 Epigastric pain: Secondary | ICD-10-CM | POA: Diagnosis present

## 2019-08-24 DIAGNOSIS — Z87891 Personal history of nicotine dependence: Secondary | ICD-10-CM | POA: Insufficient documentation

## 2019-08-24 DIAGNOSIS — Z79899 Other long term (current) drug therapy: Secondary | ICD-10-CM | POA: Insufficient documentation

## 2019-08-24 MED ORDER — SODIUM CHLORIDE 0.9% FLUSH: 3.0000 mL | Freq: Once | INTRAVENOUS | Status: DC

## 2019-08-24 NOTE — ED Triage Notes (Signed)
C/o LUQ pain since yesterday with chills .  Now pain radiating across upper abd, chest, and R shoulder. Also reports diarrhea and nausea.  Denies vomiting and urinary symptoms.

## 2019-08-25 ENCOUNTER — Emergency Department (HOSPITAL_COMMUNITY): Payer: 59

## 2019-08-25 LAB — COMPREHENSIVE METABOLIC PANEL
ALT: 11 U/L (ref 0–44)
AST: 16 U/L (ref 15–41)
Albumin: 3.8 g/dL (ref 3.5–5.0)
Alkaline Phosphatase: 67 U/L (ref 38–126)
Anion gap: 10 (ref 5–15)
BUN: <5 mg/dL — ABNORMAL LOW (ref 6–20)
CO2: 22 mmol/L (ref 22–32)
Calcium: 9.2 mg/dL (ref 8.9–10.3)
Chloride: 107 mmol/L (ref 98–111)
Creatinine, Ser: 0.73 mg/dL (ref 0.44–1.00)
GFR calc Af Amer: >60 mL/min (ref >60)
GFR calc non Af Amer: >60 mL/min (ref >60)
Glucose, Bld: 116 mg/dL — ABNORMAL HIGH (ref 70–99)
Potassium: 3.4 mmol/L — ABNORMAL LOW (ref 3.5–5.1)
Sodium: 139 mmol/L (ref 135–145)
Total Bilirubin: 0.3 mg/dL (ref 0.3–1.2)
Total Protein: 7 g/dL (ref 6.5–8.1)

## 2019-08-25 LAB — URINALYSIS, ROUTINE W REFLEX MICROSCOPIC
Bacteria, UA: NONE SEEN
Bilirubin Urine: NEGATIVE
Glucose, UA: NEGATIVE mg/dL
Ketones, ur: NEGATIVE mg/dL
Leukocytes,Ua: NEGATIVE
Nitrite: NEGATIVE
Protein, ur: NEGATIVE mg/dL
Specific Gravity, Urine: 1.013 (ref 1.005–1.030)
pH: 7 (ref 5.0–8.0)

## 2019-08-25 LAB — CBC
HCT: 35.9 % — ABNORMAL LOW (ref 36.0–46.0)
Hemoglobin: 11 g/dL — ABNORMAL LOW (ref 12.0–15.0)
MCH: 25.1 pg — ABNORMAL LOW (ref 26.0–34.0)
MCHC: 30.6 g/dL (ref 30.0–36.0)
MCV: 81.8 fL (ref 80.0–100.0)
Platelets: 379 10*3/uL (ref 150–400)
RBC: 4.39 MIL/uL (ref 3.87–5.11)
RDW: 16.4 % — ABNORMAL HIGH (ref 11.5–15.5)
WBC: 9.1 10*3/uL (ref 4.0–10.5)
nRBC: 0 % (ref 0.0–0.2)

## 2019-08-25 LAB — LIPASE, BLOOD: Lipase: 25 U/L (ref 11–51)

## 2019-08-25 LAB — I-STAT BETA HCG BLOOD, ED (MC, WL, AP ONLY): I-stat hCG, quantitative: <5 m[IU]/mL (ref <5)

## 2019-08-25 MED ORDER — DICYCLOMINE HCL 10 MG PO CAPS: 20.0000 mg | ORAL_CAPSULE | Freq: Once | ORAL | Status: DC

## 2019-08-25 MED ORDER — LIDOCAINE VISCOUS HCL 2 % MT SOLN
15.0000 mL | Freq: Once | OROMUCOSAL | Status: AC
  Administered 2019-08-25: 06:00:00 15 mL via ORAL
  Filled 2019-08-25: qty 15

## 2019-08-25 MED ORDER — PANTOPRAZOLE SODIUM 40 MG PO TBEC
40.0000 mg | DELAYED_RELEASE_TABLET | Freq: Once | ORAL | Status: AC
  Administered 2019-08-25: 40 mg via ORAL
  Filled 2019-08-25: qty 1

## 2019-08-25 MED ORDER — ONDANSETRON 4 MG PO TBDP: 4.0000 mg | ORAL_TABLET | Freq: Four times a day (QID) | ORAL | 0 refills | Status: AC | PRN

## 2019-08-25 MED ORDER — DICYCLOMINE HCL 20 MG PO TABS: 20.0000 mg | ORAL_TABLET | Freq: Three times a day (TID) | ORAL | 0 refills | Status: AC | PRN

## 2019-08-25 MED ORDER — ACETAMINOPHEN 500 MG PO TABS: 1000.0000 mg | ORAL_TABLET | Freq: Once | ORAL | Status: DC

## 2019-08-25 MED ORDER — PANTOPRAZOLE SODIUM 40 MG PO TBEC: 40.0000 mg | DELAYED_RELEASE_TABLET | Freq: Every day | ORAL | 1 refills | Status: DC

## 2019-08-25 MED ORDER — ALUM & MAG HYDROXIDE-SIMETH 200-200-20 MG/5ML PO SUSP
30.0000 mL | Freq: Once | ORAL | Status: AC
  Administered 2019-08-25: 30 mL via ORAL
  Filled 2019-08-25: qty 30

## 2019-08-25 MED ORDER — ONDANSETRON 4 MG PO TBDP
4.0000 mg | ORAL_TABLET | Freq: Once | ORAL | Status: AC
  Administered 2019-08-25: 4 mg via ORAL
  Filled 2019-08-25: qty 1

## 2019-08-25 NOTE — ED Provider Notes (Addendum)
TIME SEEN: 5:07 AM  CHIEF COMPLAINT: Epigastric abdominal pain, chest pain and shortness of breath  HPI: Patient is a 33 year old female with previous history of cholecystectomy, C-section, migraine headaches, anxiety who presents to the emergency department with epigastric burning, sharp, severe abdominal pain that started 2 days ago.  No aggravating or alleviating factors.  Has had nausea but no vomiting.  Has had some diarrhea.  Reports chills without fever.  States pain is now radiating into the center of her chest and into her posterior shoulders.  No previous history of gastritis or ulcers.  No history of previous endoscopy.  She does not have a local GI physician.  No hematemesis, bloody stools, melena.  No dysuria, hematuria, abnormal vaginal discharge.  She is currently on her period.  Has not tried any medications at home.  States she has had some shortness of breath with cough and congestion.  Does not feel she has been exposed to COVID.  No history of PE, DVT, exogenous estrogen use, recent fractures, surgery, trauma, hospitalization or prolonged travel. No lower extremity swelling or pain. No calf tenderness.  ROS: See HPI Constitutional: no fever  Eyes: no drainage  ENT: no runny nose   Cardiovascular:   chest pain  Resp:  SOB  GI: no vomiting GU: no dysuria Integumentary: no rash  Allergy: no hives  Musculoskeletal: no leg swelling  Neurological: no slurred speech ROS otherwise negative  PAST MEDICAL HISTORY/PAST SURGICAL HISTORY:  Past Medical History:  Diagnosis Date  . Anxiety   . Delivery of pregnancy by cesarean section 03/27/2017  . Depression   . Iron deficiency anemia   . Migraines   . Normal pregnancy 07/12/2011  . SROM (spontaneous rupture of membranes) 07/12/2011  . SVD (spontaneous vaginal delivery) 07/13/2011    MEDICATIONS:  Prior to Admission medications   Medication Sig Start Date End Date Taking? Authorizing Provider  busPIRone (BUSPAR) 10 MG tablet  Take 1 tablet (10 mg total) by mouth 2 (two) times daily. 12/02/18   Anders SimmondsMcClung, Angela M, PA-C  calcium carbonate (TUMS - DOSED IN MG ELEMENTAL CALCIUM) 500 MG chewable tablet Chew 1-2 tablets by mouth 2 (two) times daily as needed for indigestion or heartburn.     [provider]  hydrochlorothiazide (HYDRODIURIL) 25 MG tablet Take 1 tablet (25 mg total) by mouth daily. Patient not taking: Reported on 11/16/2018 06/13/17   Anselm PancoastJoy, Shawn C, PA-C  ibuprofen (ADVIL,MOTRIN) 800 MG tablet Take 1 tablet (800 mg total) by mouth every 8 (eight) hours. Patient not taking: Reported on 11/16/2018 03/29/17   Bovard-Stuckert, Augusto GambleJody, MD  methocarbamol (ROBAXIN) 500 MG tablet Take 2 tablets (1,000 mg total) by mouth 3 (three) times daily. Prn muscle spasm 12/02/18   Georgian CoMcClung, Angela M, PA-C  omeprazole (PRILOSEC) 40 MG capsule Take 40 mg by mouth daily as needed (for heartburn).    [provider]  Prenatal Vit-Fe Fumarate-FA (PRENATAL MULTIVITAMIN) TABS tablet Take 1 tablet by mouth daily at 12 noon. Patient not taking: Reported on 11/16/2018 03/29/17   Bovard-Stuckert, Augusto GambleJody, MD  sertraline (ZOLOFT) 100 MG tablet Take 100 mg by mouth daily.    [provider]  Vitamin D, Ergocalciferol, (DRISDOL) 1.25 MG (50000 UT) CAPS capsule Take 1 capsule (50,000 Units total) by mouth every 7 (seven) days. 12/07/18   Anders SimmondsMcClung, Angela M, PA-C    ALLERGIES:  No Known Allergies  SOCIAL HISTORY:  Social History   Tobacco Use  . Smoking status: Former Smoker    Packs/day: 0.10  Years: 3.00    Pack years: 0.30    Types: Cigarettes    Quit date: 11/17/2010    Years since quitting: 8.7  . Smokeless tobacco: Never Used  Substance Use Topics  . Alcohol use: No    FAMILY HISTORY: No family history on file.  EXAM: BP 135/83 (BP Location: Right Arm)   Pulse 67   Temp 98 F (36.7 C) (Oral)   Resp 18   LMP 08/21/2019   SpO2 100%  CONSTITUTIONAL: Alert and oriented and responds appropriately to  questions. Well-appearing; well-nourished HEAD: Normocephalic EYES: Conjunctivae clear, pupils appear equal, EOMI ENT: normal nose; moist mucous membranes NECK: Supple, no meningismus, no nuchal rigidity, no LAD  CARD: RRR; S1 and S2 appreciated; no murmurs, no clicks, no rubs, no gallops RESP: Normal chest excursion without splinting or tachypnea; breath sounds clear and equal bilaterally; no wheezes, no rhonchi, no rales, no hypoxia or respiratory distress, speaking full sentences ABD/GI: Normal bowel sounds; non-distended; soft, non-tender, no rebound, no guarding, no peritoneal signs, no hepatosplenomegaly, very benign abdominal exam, no tenderness at McBurney's point, no significant pain with deep palpation diffusely throughout the abdomen BACK:  The back appears normal and is non-tender to palpation, there is no CVA tenderness EXT: Normal ROM in all joints; non-tender to palpation; no edema; normal capillary refill; no cyanosis, no calf tenderness or swelling    SKIN: Normal color for age and race; warm; no rash NEURO: Moves all extremities equally PSYCH: The patient's mood and manner are appropriate. Grooming and personal hygiene are appropriate.  MEDICAL DECISION MAKING: Patient here with epigastric abdominal pain.  Her labs are unremarkable.  Normal LFTs, lipase.  No leukocytosis.  Suspect gastritis, GERD, possible ulcer but doubt perforation.  She has had a cholecystectomy.  No signs or symptoms to suggest cholangitis, pancreatitis today.  Doubt appendicitis, bowel obstruction, colitis or diverticulitis.  I do not feel she needs emergent imaging.  Will give Protonix, GI cocktail, Zofran.  She does complain of some chest discomfort and shortness of breath.  Suspect this may be GI in nature.  She is PERC negative and has no risk factors for ACS other than being a previous smoker that quit in 2012.  Doubt dissection.  Will reassess after medications.  ED PROGRESS: Patient's chest x-ray is  clear.  EKG unremarkable.  Again doubt ACS, PE or dissection.  Reports pain is somewhat improved after GI cocktail, Protonix and Zofran.  Able to tolerate p.o.  Will give dose of Bentyl here.  Abdominal exam continues to be very benign.  Do not feel she needs emergent CT imaging and she agrees with this plan.  Will discharge home with prescriptions for Zofran, Bentyl, Protonix.  Have recommended she avoid NSAIDs at this time.  Discussed at length return precautions.  Recommend bland diet and will give outpatient GI follow-up.  Suspect gastritis vs GERD vs possible non-perforated ulcer.  Patient drove herself to the emergency department and would like to drive home.  I discussed that we will have to avoid narcotic pain medication and she agrees with this plan.  At this time, I do not feel there is any life-threatening condition present. I have reviewed and discussed all results (EKG, imaging, lab, urine as appropriate) and exam findings with patient/family. I have reviewed nursing notes and appropriate previous records.  I feel the patient is safe to be discharged home without further emergent workup and can continue workup as an outpatient as needed. Discussed usual and customary return  precautions. Patient/family verbalize understanding and are comfortable with this plan.  Outpatient follow-up has been provided as needed. All questions have been answered.     EKG Interpretation  Date/Time:  Thursday August 25 2019 05:24:12 EDT Ventricular Rate:  61 PR Interval:    QRS Duration: 103 QT Interval:  438 QTC Calculation: 442 R Axis:   32 Text Interpretation:  Sinus rhythm Borderline short PR interval Rate is slower than previous Confirmed by Rochele Raring [phone]) on 08/25/2019 5:30:41 AM        Lori Roy was evaluated in Emergency Department on 08/25/2019 for the symptoms described in the history of present illness. She was evaluated in the context of the global COVID-19 pandemic, which  necessitated consideration that the patient might be at risk for infection with the SARS-CoV-2 virus that causes COVID-19. Institutional protocols and algorithms that pertain to the evaluation of patients at risk for COVID-19 are in a state of rapid change based on information released by regulatory bodies including the CDC and federal and state organizations. These policies and algorithms were followed during the patient's care in the ED.    Liller Yohn, Layla Maw, DO 08/25/19 [id number]    Senaida Chilcote, Layla Maw, DO 08/25/19 [phone]

## 2019-08-25 NOTE — Discharge Instructions (Addendum)
Please avoid NSAIDs such as aspirin (Goody powders), ibuprofen (Motrin, Advil), naproxen (Aleve) as these may worsen your symptoms.  Tylenol 1000 mg every 6 hours is safe to take as long as you have no history of liver problems (heavy alcohol use, cirrhosis, hepatitis).  Please avoid spicy, acidic (citrus fruits, tomato based sauces, salsa), greasy, fatty foods.  Please avoid caffeine and alcohol.   ° °

## 2019-12-03 ENCOUNTER — Emergency Department (HOSPITAL_COMMUNITY): Payer: 59

## 2019-12-03 ENCOUNTER — Encounter (HOSPITAL_COMMUNITY): Payer: Self-pay | Admitting: *Deleted

## 2019-12-03 ENCOUNTER — Emergency Department (HOSPITAL_COMMUNITY)
Admission: EM | Admit: 2019-12-03 | Discharge: 2019-12-03 | Disposition: A | Discharge status: Home / Self Care | Payer: 59 | Attending: Emergency Medicine | Admitting: Emergency Medicine

## 2019-12-03 ENCOUNTER — Other Ambulatory Visit: Payer: Self-pay

## 2019-12-03 DIAGNOSIS — R0602 Shortness of breath: Secondary | ICD-10-CM | POA: Insufficient documentation

## 2019-12-03 DIAGNOSIS — R0789 Other chest pain: Secondary | ICD-10-CM | POA: Diagnosis not present

## 2019-12-03 DIAGNOSIS — D509 Iron deficiency anemia, unspecified: Secondary | ICD-10-CM | POA: Diagnosis not present

## 2019-12-03 DIAGNOSIS — R079 Chest pain, unspecified: Secondary | ICD-10-CM

## 2019-12-03 LAB — BASIC METABOLIC PANEL
Anion gap: 10 (ref 5–15)
BUN: 8 mg/dL (ref 6–20)
CO2: 25 mmol/L (ref 22–32)
Calcium: 8.9 mg/dL (ref 8.9–10.3)
Chloride: 103 mmol/L (ref 98–111)
Creatinine, Ser: 0.7 mg/dL (ref 0.44–1.00)
GFR calc Af Amer: >60 mL/min (ref >60)
GFR calc non Af Amer: >60 mL/min (ref >60)
Glucose, Bld: 131 mg/dL — ABNORMAL HIGH (ref 70–99)
Potassium: 3.5 mmol/L (ref 3.5–5.1)
Sodium: 138 mmol/L (ref 135–145)

## 2019-12-03 LAB — I-STAT BETA HCG BLOOD, ED (MC, WL, AP ONLY): I-stat hCG, quantitative: <5 m[IU]/mL (ref <5)

## 2019-12-03 LAB — CBC
HCT: 35.7 % — ABNORMAL LOW (ref 36.0–46.0)
Hemoglobin: 10.8 g/dL — ABNORMAL LOW (ref 12.0–15.0)
MCH: 24.1 pg — ABNORMAL LOW (ref 26.0–34.0)
MCHC: 30.3 g/dL (ref 30.0–36.0)
MCV: 79.5 fL — ABNORMAL LOW (ref 80.0–100.0)
Platelets: 378 10*3/uL (ref 150–400)
RBC: 4.49 MIL/uL (ref 3.87–5.11)
RDW: 17 % — ABNORMAL HIGH (ref 11.5–15.5)
WBC: 10.2 10*3/uL (ref 4.0–10.5)
nRBC: 0 % (ref 0.0–0.2)

## 2019-12-03 LAB — TROPONIN I (HIGH SENSITIVITY)
Troponin I (High Sensitivity): <2 ng/L (ref <18)
Troponin I (High Sensitivity): <2 ng/L (ref <18)

## 2019-12-03 LAB — D-DIMER, QUANTITATIVE: D-Dimer, Quant: 0.61 ug/mL-FEU — ABNORMAL HIGH (ref 0.00–0.50)

## 2019-12-03 MED ORDER — METHOCARBAMOL 500 MG PO TABS: 500.0000 mg | ORAL_TABLET | Freq: Two times a day (BID) | ORAL | 0 refills | Status: AC

## 2019-12-03 MED ORDER — KETOROLAC TROMETHAMINE 15 MG/ML IJ SOLN
15.0000 mg | Freq: Once | INTRAMUSCULAR | Status: AC
  Administered 2019-12-03: 15 mg via INTRAVENOUS
  Filled 2019-12-03: qty 1

## 2019-12-03 MED ORDER — IOHEXOL 350 MG/ML SOLN
75.0000 mL | Freq: Once | INTRAVENOUS | Status: AC | PRN
  Administered 2019-12-03: 20:00:00 75 mL via INTRAVENOUS

## 2019-12-03 MED ORDER — SODIUM CHLORIDE 0.9% FLUSH: 3.0000 mL | Freq: Once | INTRAVENOUS | Status: DC

## 2019-12-03 MED ORDER — NAPROXEN 500 MG PO TABS: 500.0000 mg | ORAL_TABLET | Freq: Two times a day (BID) | ORAL | 0 refills | Status: AC

## 2019-12-03 NOTE — ED Notes (Signed)
Patient verbalizes understanding of discharge instructions. Opportunity for questioning and answers were provided. Armband removed by staff, pt discharged from ED. Pt. ambulatory and discharged home.  

## 2019-12-03 NOTE — ED Triage Notes (Signed)
Chest pain for 2 days with some sob  No n or diaainess  She was sent here from  [facility] for treatnebt  lmp now

## 2019-12-03 NOTE — Discharge Instructions (Signed)
Your work-up today is very reassuring and does not suggest an acute problem with your heart or lungs.  Take naproxen twice daily, you can also use muscle relaxer as needed to help with pain.  If symptoms are not improving please follow-up with your primary care doctor.  Return to the ED for new or worsening symptoms.

## 2019-12-03 NOTE — ED Provider Notes (Addendum)
[facility] Provider Note   CSN: [id number] Arrival date & time: 12/03/19  1646     History Chief Complaint  Patient presents with  . Chest Pain    Lori Roy is a 34 y.o. female.  Lori Roy is a 34 y.o. female with history of migraines, iron deficiency anemia, who presents to the emergency [facility] for evaluation of chest pain.  Patient reports that over the past 3 days she has been experiencing intermittent pains over the left side of her chest.  She describes them as sharp.  She reports at first pain was located just adjacent to the sternum, but she has reported some pain migrating beneath the left breast today.  She notices pain is sometimes worse with deep breath as well as with movement.  She has not noted any skin changes, she denies any injury to the chest wall.  She reports that she sometimes feels short of breath.  She has not had any associated cough or fever.  No diaphoresis, nausea, vomiting, abdominal pain.  No radiation of the chest pain to the back.  No lightheadedness or syncope.  She reports she had a similar episode of chest pain a year or 2 ago, was seen by PCP and felt this was likely musculoskeletal, she was treated with Robaxin with improvement.  She denies any history of heart disease or blood clots.  She is not on any OCPs, denies lower extremity swelling or pain.  She has not taken anything to treat this pain prior to arrival.  She was initially seen in urgent care and sent to the ED for further evaluation.        Past Medical History:  Diagnosis Date  . Anxiety   . Delivery of pregnancy by cesarean section 03/27/2017  . Depression   . Iron deficiency anemia   . Migraines   . Normal pregnancy 07/12/2011  . SROM (spontaneous rupture of membranes) 07/12/2011  . SVD (spontaneous vaginal delivery) 07/13/2011    Patient Active Problem List   Diagnosis Date Noted  . Delivery of pregnancy by cesarean section  03/27/2017  . Delayed delivery after SROM (spontaneous rupture of membranes) 03/26/2017  . Normal labor 07/31/2014  . NSVD (normal spontaneous vaginal delivery) 07/31/2014  . Cholelithiasis 05/10/2013  . SVD (spontaneous vaginal delivery) 07/13/2011  . History of migraines     Past Surgical History:  Procedure Laterality Date  . CESAREAN SECTION WITH BILATERAL TUBAL LIGATION N/A 03/27/2017   Procedure: CESAREAN SECTION WITH BILATERAL TUBAL LIGATION;  Surgeon: Sherian Rein, MD;  Location: [facility];  Service: Obstetrics;  Laterality: N/A;  . CHOLECYSTECTOMY N/A 05/11/2013   Procedure: LAPAROSCOPIC CHOLECYSTECTOMY WITH INTRAOPERATIVE CHOLANGIOGRAM;  Surgeon: Cherylynn Ridges, MD;  Location: [facility];  Service: General;  Laterality: N/A;  . WISDOM TOOTH EXTRACTION  ~ 2006     OB History    Gravida  3   Para  3   Term  3   Preterm  0   AB  0   Living  3     SAB  0   TAB  0   Ectopic  0   Multiple  0   Live Births  3           No family history on file.  Social History   Tobacco Use  . Smoking status: Former Smoker    Packs/day: 0.10    Years: 3.00    Pack years: 0.30  Types: Cigarettes    Quit date: 11/17/2010    Years since quitting: 9.0  . Smokeless tobacco: Never Used  Substance Use Topics  . Alcohol use: No  . Drug use: Yes    Types: Marijuana    Home Medications Prior to Admission medications   Medication Sig Start Date End Date Taking? Authorizing Provider  busPIRone (BUSPAR) 10 MG tablet Take 1 tablet (10 mg total) by mouth 2 (two) times daily. 12/02/18   Argentina Donovan, PA-C  calcium carbonate (TUMS - DOSED IN MG ELEMENTAL CALCIUM) 500 MG chewable tablet Chew 1-2 tablets by mouth 2 (two) times daily as needed for indigestion or heartburn.     [provider]  dicyclomine (BENTYL) 20 MG tablet Take 1 tablet (20 mg total) by mouth every 8 (eight) hours as needed for spasms (Abdominal cramping and pain). 08/25/19   Ward,  Delice Bison, DO  hydrochlorothiazide (HYDRODIURIL) 25 MG tablet Take 1 tablet (25 mg total) by mouth daily. Patient not taking: Reported on 11/16/2018 06/13/17   Lorayne Bender, PA-C  ibuprofen (ADVIL,MOTRIN) 800 MG tablet Take 1 tablet (800 mg total) by mouth every 8 (eight) hours. Patient not taking: Reported on 11/16/2018 03/29/17   Bovard-Stuckert, Jeral Fruit, MD  methocarbamol (ROBAXIN) 500 MG tablet Take 2 tablets (1,000 mg total) by mouth 3 (three) times daily. Prn muscle spasm 12/02/18   Freeman Caldron M, PA-C  omeprazole (PRILOSEC) 40 MG capsule Take 40 mg by mouth daily as needed (for heartburn).    [provider]  ondansetron (ZOFRAN ODT) 4 MG disintegrating tablet Take 1 tablet (4 mg total) by mouth every 6 (six) hours as needed. 08/25/19   Ward, Delice Bison, DO  pantoprazole (PROTONIX) 40 MG tablet Take 1 tablet (40 mg total) by mouth daily. 08/25/19   Ward, Delice Bison, DO  Prenatal Vit-Fe Fumarate-FA (PRENATAL MULTIVITAMIN) TABS tablet Take 1 tablet by mouth daily at 12 noon. Patient not taking: Reported on 11/16/2018 03/29/17   Bovard-Stuckert, Jeral Fruit, MD  sertraline (ZOLOFT) 100 MG tablet Take 100 mg by mouth daily.    [provider]  Vitamin D, Ergocalciferol, (DRISDOL) 1.25 MG (50000 UT) CAPS capsule Take 1 capsule (50,000 Units total) by mouth every 7 (seven) days. 12/07/18   Argentina Donovan, PA-C    Allergies    Patient has no known allergies.  Review of Systems   Review of Systems  Constitutional: Negative for chills and fever.  HENT: Negative.   Respiratory: Positive for shortness of breath.   Cardiovascular: Positive for chest pain. Negative for palpitations and leg swelling.  Gastrointestinal: Negative for abdominal pain, diarrhea, nausea and vomiting.  Genitourinary: Negative for dysuria.  Musculoskeletal: Negative for back pain and myalgias.  Skin: Negative for color change and rash.  Neurological: Negative for dizziness, syncope and light-headedness.     Physical Exam Updated Vital Signs BP (!) 150/90   Pulse 71   Temp 98.3 F (36.8 C)   Resp 18   Ht 5\' 1"  (1.549 m)   Wt 79.8 kg   LMP 12/03/2019   SpO2 99%   BMI 33.25 kg/m   Physical Exam Vitals and nursing note reviewed.  Constitutional:      General: She is not in acute distress.    Appearance: She is well-developed. She is not ill-appearing or diaphoretic.     Comments: Well-appearing and in no distress  HENT:     Head: Normocephalic and atraumatic.  Eyes:     General:  Right eye: No discharge.        Left eye: No discharge.     Pupils: Pupils are equal, round, and reactive to light.  Cardiovascular:     Rate and Rhythm: Normal rate and regular rhythm.     Pulses:          Radial pulses are 2+ on the right side and 2+ on the left side.     Heart sounds: Normal heart sounds. No murmur. No friction rub. No gallop.   Pulmonary:     Effort: Pulmonary effort is normal. No respiratory distress.     Breath sounds: Normal breath sounds. No wheezing or rales.     Comments: Respirations equal and unlabored, patient able to speak in full sentences, lungs clear to auscultation bilaterally Chest:     Chest wall: Tenderness present.     Comments: Tenderness to palpation over the left costochondral margin as well as under the left breast without palpable deformity or overlying skin changes Abdominal:     General: Bowel sounds are normal. There is no distension.     Palpations: Abdomen is soft. There is no mass.     Tenderness: There is no abdominal tenderness. There is no guarding.     Comments: Abdomen soft, nondistended, nontender to palpation in all quadrants without guarding or peritoneal signs  Musculoskeletal:        General: No deformity.     Cervical back: Neck supple.     Right lower leg: No tenderness. No edema.     Left lower leg: No tenderness.  Skin:    General: Skin is warm and dry.     Capillary Refill: Capillary refill takes less than 2 seconds.   Neurological:     Mental Status: She is alert.     Coordination: Coordination normal.     Comments: Speech is clear, able to follow commands Moves extremities without ataxia, coordination intact     ED Results / Procedures / Treatments   Labs (all labs ordered are listed, but only abnormal results are displayed) Labs Reviewed  BASIC METABOLIC PANEL - Abnormal; Notable for the following components:      Result Value   Glucose, Bld 131 (*)    All other components within normal limits  CBC - Abnormal; Notable for the following components:   Hemoglobin 10.8 (*)    HCT 35.7 (*)    MCV 79.5 (*)    MCH 24.1 (*)    RDW 17.0 (*)    All other components within normal limits  D-DIMER, QUANTITATIVE (NOT AT [facility]) - Abnormal; Notable for the following components:   D-Dimer, Quant 0.61 (*)    All other components within normal limits  I-STAT BETA HCG BLOOD, ED ([facility], [facility], [facility] ONLY)  TROPONIN I (HIGH SENSITIVITY)  TROPONIN I (HIGH SENSITIVITY)    EKG ED ECG REPORT   Date: 12/03/2019  Rate: 67  Rhythm: normal sinus rhythm  QRS Axis: normal  Intervals: normal  ST/T Wave abnormalities: nonspecific ST changes in anterior leads  Conduction Disutrbances:none  Narrative Interpretation: NSR with some non-specific ST changed in anterior leads, unchanged when compared to previous  Old EKG Reviewed: unchanged  I have personally reviewed the EKG tracing and agree with the computerized printout as noted.   Radiology DG Chest 2 View  Result Date: 12/03/2019 CLINICAL DATA:  Chest pain and shortness of breath for 2 days EXAM: CHEST - 2 VIEW COMPARISON:  None. FINDINGS: The heart size and mediastinal contours  are within normal limits. Both lungs are clear. The visualized skeletal structures are unremarkable. Cholecystectomy clips. IMPRESSION: Normal chest radiograph. Electronically Signed   By: Duanne Guess D.O.   On: 12/03/2019 17:29   CT Angio Chest PE W and/or Wo Contrast  Result Date:  12/03/2019 CLINICAL DATA:  Chest pain for 2 days. Positive D-dimer. PE suspected. EXAM: CT ANGIOGRAPHY CHEST WITH CONTRAST TECHNIQUE: Multidetector CT imaging of the chest was performed using the standard protocol during bolus administration of intravenous contrast. Multiplanar CT image reconstructions and MIPs were obtained to evaluate the vascular anatomy. CONTRAST:  14mL OMNIPAQUE IOHEXOL 350 MG/ML SOLN COMPARISON:  Chest radiograph December 03, 2019, CT angiogram of the chest November 16, 2018 FINDINGS: Cardiovascular: Satisfactory opacification of the pulmonary arteries to the segmental level. No evidence of pulmonary embolism. Normal heart size. No pericardial effusion. Mediastinum/Nodes: No enlarged mediastinal, hilar, or axillary lymph nodes. Thyroid gland, trachea, and esophagus demonstrate no significant findings. Lungs/Pleura: Lungs are clear. No pleural effusion or pneumothorax. Upper Abdomen: No acute abnormality. Musculoskeletal: No chest wall abnormality. No acute or significant osseous findings. Review of the MIP images confirms the above findings. IMPRESSION: No evidence of pulmonary embolus or other acute abnormality within the thorax. Electronically Signed   By: Ted Mcalpine M.D.   On: 12/03/2019 19:55    Procedures Procedures (including critical care time)  Medications Ordered in ED Medications  sodium chloride flush (NS) 0.9 % injection 3 mL (has no administration in time range)  iohexol (OMNIPAQUE) 350 MG/ML injection 75 mL (75 mLs Intravenous Contrast Given 12/03/19 1939)  ketorolac (TORADOL) 15 MG/ML injection 15 mg (15 mg Intravenous Given 12/03/19 2040)    ED Course  I have reviewed the triage vital signs and the nursing notes.  Pertinent labs & imaging results that were available during my care of the patient were reviewed by me and considered in my medical decision making (see chart for details).    MDM Rules/Calculators/A&P                       Patient presents  with 3 days of intermittent left-sided chest pain, no previous history of heart disease.  Chest pain is not likely of cardiac or pulmonary etiology d/t presentation, D-dimer negative, VSS, no tracheal deviation, no JVD or new murmur, RRR, breath sounds equal bilaterally, EKG without acute abnormalities, negative troponin, and negative CXR.  Suspect musculoskeletal pain or costochondritis, will treat with anti-inflammatories and muscle relaxers.  Patient is to be discharged with recommendation to follow up with PCP in regards to today's hospital visit.  Pt has been advised to return to the ED if CP becomes exertional, associated with diaphoresis or nausea, radiates to left jaw/arm, worsens or becomes concerning in any way. Pt appears reliable for follow up and is agreeable to discharge.    Final Clinical Impression(s) / ED Diagnoses Final diagnoses:  Left-sided chest pain    Rx / DC Orders ED Discharge Orders    None       Legrand Rams 12/03/19 2114    Tegeler, Canary Brim, MD 12/04/19 2126    Dartha Lodge, PA-C 12/19/19 1100    Tegeler, Canary Brim, MD 12/20/19 [phone]

## 2020-06-30 IMAGING — CR DG CHEST 2V
2 series · 2 of 2 positions shown · non-contrast
Comparison: None.

CLINICAL DATA: Chest pain and shortness of breath for 2 days

EXAM:
CHEST - 2 VIEW

[chest pa]
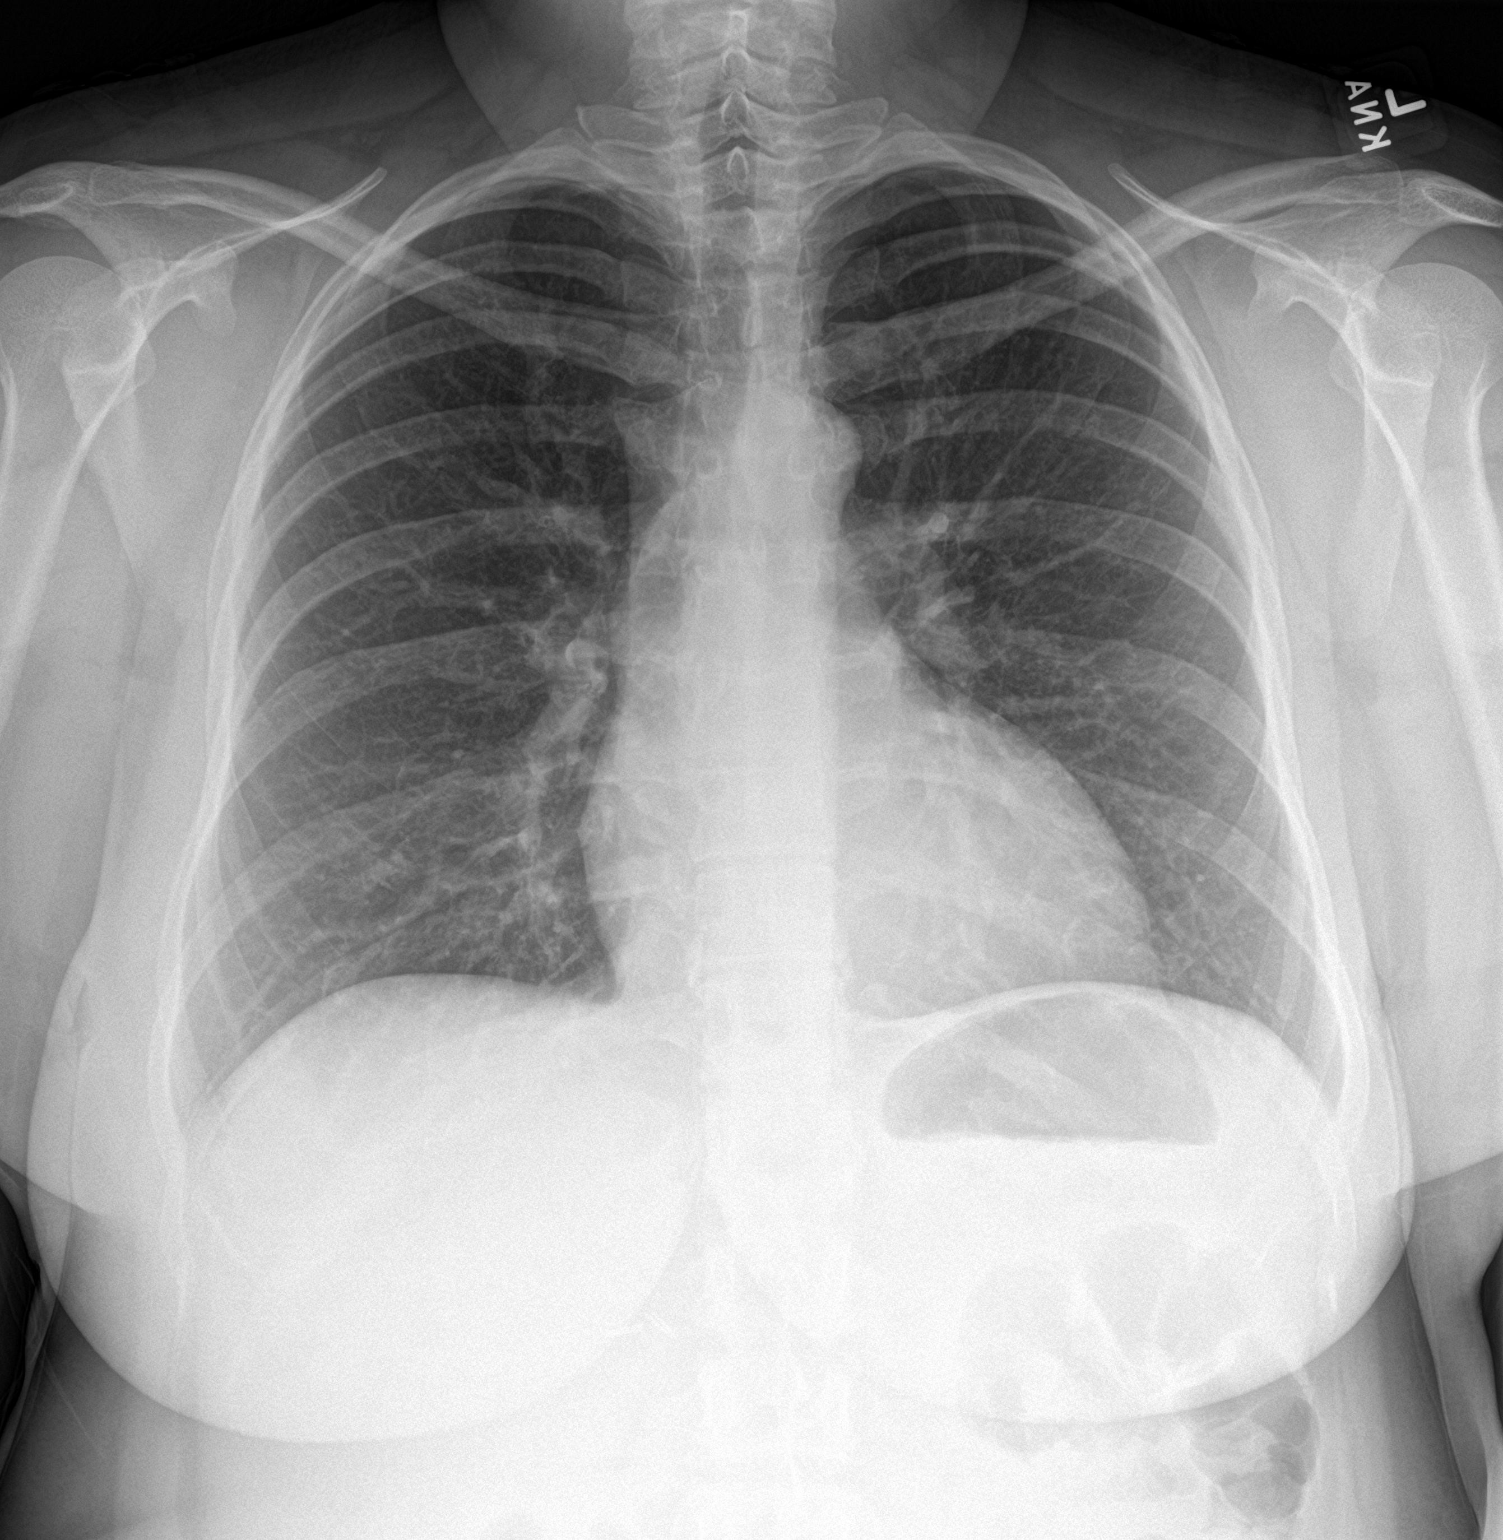

[chest lat]
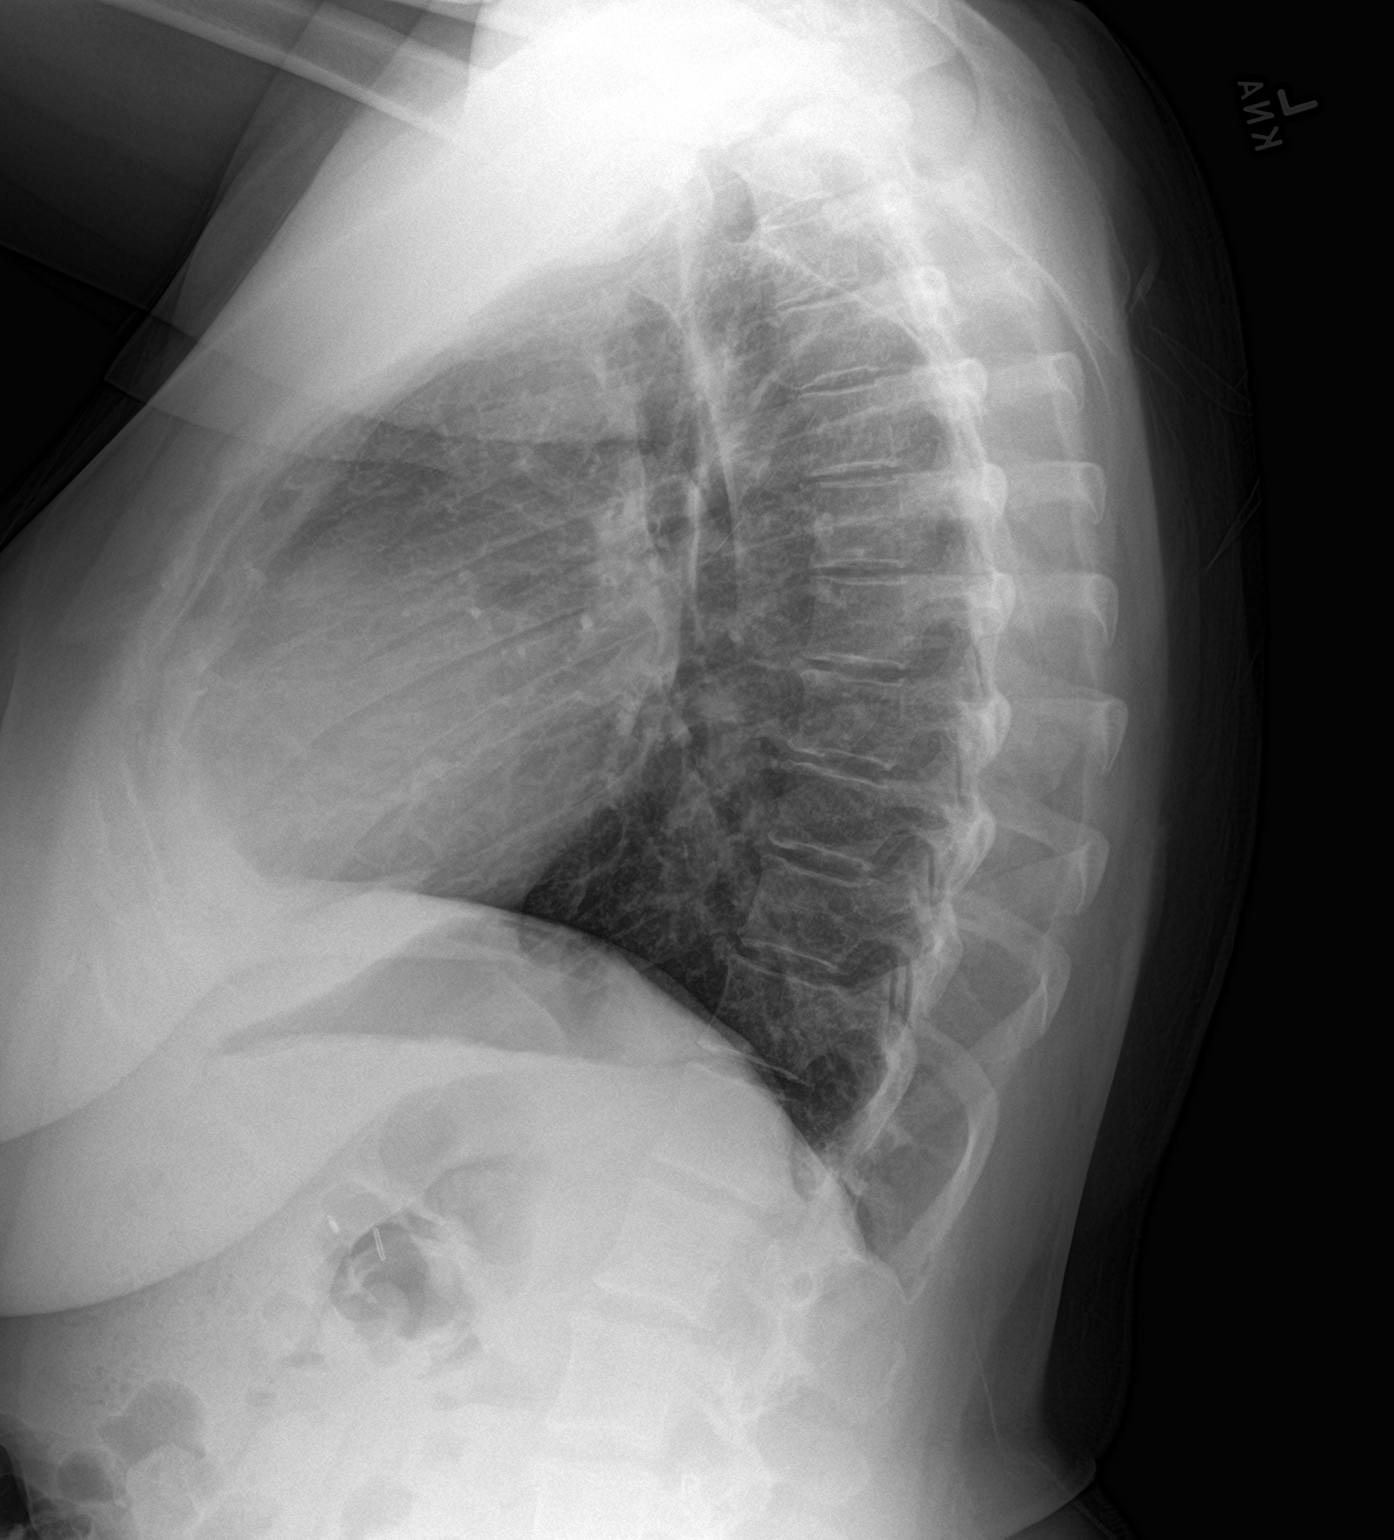

[2 of 2 positions shown; findings below may reference images not displayed]

FINDINGS: The heart size and mediastinal contours are within normal limits.
Both lungs are clear. The visualized skeletal structures are
unremarkable. Cholecystectomy clips.
IMPRESSION: Normal chest radiograph.

## 2020-09-07 ENCOUNTER — Telehealth: Payer: Self-pay | Admitting: Hematology

## 2020-09-07 NOTE — Telephone Encounter (Signed)
Received anew hem referral from [facility] family Medicine for IDA. Lori Roy has been cld and scheduled to see Dr. Candise Che on 11/10 at 1pm. Pt aware to arrve 15 minutes early.

## 2020-09-26 ENCOUNTER — Inpatient Hospital Stay: Payer: 59 | Attending: Hematology | Admitting: Hematology

## 2020-09-26 ENCOUNTER — Other Ambulatory Visit: Payer: Self-pay

## 2020-09-26 ENCOUNTER — Inpatient Hospital Stay: Payer: 59

## 2020-09-26 ENCOUNTER — Other Ambulatory Visit: Payer: Self-pay | Admitting: *Deleted

## 2020-09-26 VITALS — BP 142/67 | HR 59 | Temp 97.6°F | Resp 18 | Ht 61.0 in | Wt 202.9 lb

## 2020-09-26 DIAGNOSIS — R0602 Shortness of breath: Secondary | ICD-10-CM | POA: Diagnosis not present

## 2020-09-26 DIAGNOSIS — E538 Deficiency of other specified B group vitamins: Secondary | ICD-10-CM | POA: Diagnosis not present

## 2020-09-26 DIAGNOSIS — D500 Iron deficiency anemia secondary to blood loss (chronic): Secondary | ICD-10-CM

## 2020-09-26 DIAGNOSIS — Z87891 Personal history of nicotine dependence: Secondary | ICD-10-CM | POA: Diagnosis not present

## 2020-09-26 DIAGNOSIS — F419 Anxiety disorder, unspecified: Secondary | ICD-10-CM | POA: Diagnosis not present

## 2020-09-26 DIAGNOSIS — R42 Dizziness and giddiness: Secondary | ICD-10-CM | POA: Diagnosis not present

## 2020-09-26 DIAGNOSIS — E559 Vitamin D deficiency, unspecified: Secondary | ICD-10-CM | POA: Diagnosis not present

## 2020-09-26 DIAGNOSIS — D509 Iron deficiency anemia, unspecified: Secondary | ICD-10-CM | POA: Insufficient documentation

## 2020-09-26 DIAGNOSIS — Z9049 Acquired absence of other specified parts of digestive tract: Secondary | ICD-10-CM

## 2020-09-26 DIAGNOSIS — Z79899 Other long term (current) drug therapy: Secondary | ICD-10-CM

## 2020-09-26 DIAGNOSIS — R002 Palpitations: Secondary | ICD-10-CM | POA: Insufficient documentation

## 2020-09-26 DIAGNOSIS — F329 Major depressive disorder, single episode, unspecified: Secondary | ICD-10-CM | POA: Diagnosis not present

## 2020-09-26 LAB — CBC WITH DIFFERENTIAL/PLATELET
Abs Immature Granulocytes: 0.02 10*3/uL (ref 0.00–0.07)
Basophils Absolute: 0.1 10*3/uL (ref 0.0–0.1)
Basophils Relative: 0 %
Eosinophils Absolute: 0.3 10*3/uL (ref 0.0–0.5)
Eosinophils Relative: 2 %
HCT: 35.8 % — ABNORMAL LOW (ref 36.0–46.0)
Hemoglobin: 10.8 g/dL — ABNORMAL LOW (ref 12.0–15.0)
Immature Granulocytes: 0 %
Lymphocytes Relative: 31 %
Lymphs Abs: 4 10*3/uL (ref 0.7–4.0)
MCH: 23.7 pg — ABNORMAL LOW (ref 26.0–34.0)
MCHC: 30.2 g/dL (ref 30.0–36.0)
MCV: 78.5 fL — ABNORMAL LOW (ref 80.0–100.0)
Monocytes Absolute: 0.4 10*3/uL (ref 0.1–1.0)
Monocytes Relative: 4 %
Neutro Abs: 7.9 10*3/uL — ABNORMAL HIGH (ref 1.7–7.7)
Neutrophils Relative %: 63 %
Platelets: 363 10*3/uL (ref 150–400)
RBC: 4.56 MIL/uL (ref 3.87–5.11)
RDW: 18.6 % — ABNORMAL HIGH (ref 11.5–15.5)
WBC: 12.7 10*3/uL — ABNORMAL HIGH (ref 4.0–10.5)
nRBC: 0 % (ref 0.0–0.2)

## 2020-09-26 LAB — CMP (CANCER CENTER ONLY)
ALT: 10 U/L (ref 0–44)
AST: 11 U/L — ABNORMAL LOW (ref 15–41)
Albumin: 3.8 g/dL (ref 3.5–5.0)
Alkaline Phosphatase: 94 U/L (ref 38–126)
Anion gap: 8 (ref 5–15)
BUN: 6 mg/dL (ref 6–20)
CO2: 27 mmol/L (ref 22–32)
Calcium: 8.9 mg/dL (ref 8.9–10.3)
Chloride: 103 mmol/L (ref 98–111)
Creatinine: 0.81 mg/dL (ref 0.44–1.00)
GFR, Estimated: >60 mL/min (ref >60)
Glucose, Bld: 116 mg/dL — ABNORMAL HIGH (ref 70–99)
Potassium: 3.5 mmol/L (ref 3.5–5.1)
Sodium: 138 mmol/L (ref 135–145)
Total Bilirubin: 0.4 mg/dL (ref 0.3–1.2)
Total Protein: 7.2 g/dL (ref 6.5–8.1)

## 2020-09-26 LAB — IRON AND TIBC
Iron: 31 ug/dL — ABNORMAL LOW (ref 41–142)
Saturation Ratios: 6 % — ABNORMAL LOW (ref 21–57)
TIBC: 543 ug/dL — ABNORMAL HIGH (ref 236–444)
UIBC: 511 ug/dL — ABNORMAL HIGH (ref 120–384)

## 2020-09-26 LAB — FERRITIN: Ferritin: 5 ng/mL — ABNORMAL LOW (ref 11–307)

## 2020-09-26 LAB — VITAMIN B12: Vitamin B-12: 124 pg/mL — ABNORMAL LOW (ref 180–914)

## 2020-09-26 MED ORDER — POLYSACCHARIDE IRON COMPLEX 150 MG PO CAPS
150.0000 mg | ORAL_CAPSULE | Freq: Two times a day (BID) | ORAL | 3 refills | Status: AC
Start: 2020-09-26 — End: ?

## 2020-09-26 NOTE — Patient Instructions (Signed)
Thank you for choosing [facility] to provide your oncology and hematology care.   Should you have questions after your visit to the [facility] ([facility]), please contact this office at [phone] between 8:30 AM and 4:30 PM.  Voice mails left after 4:00 PM may not be returned until the following business day.  Calls received after 4:30 PM will be answered by an off-site Nurse Triage Line.    Prescription Refills:  Please have your pharmacy contact us directly for most prescription requests.  Contact the office directly for refills of narcotics (pain medications). Allow 48-72 hours for refills.  Appointments: Please contact the [facility] scheduling department [phone] for questions regarding [facility] appointment scheduling.  Contact the schedulers with any scheduling changes so that your appointment can be rescheduled in a timely manner.   Central Scheduling for [place] [phone] - Call to schedule procedures such as PET scans, CT scans, MRI, Ultrasound, etc.  To afford each patient quality time with our providers, please arrive 30 minutes before your scheduled appointment time.  If you arrive late for your appointment, you may be asked to reschedule.  We strive to give you quality time with our providers, and arriving late affects you and other patients whose appointments are after yours. If you are a no show for multiple scheduled visits, you may be dismissed from the clinic at the providers discretion.     Resources: [facility] Social Workers [phone] for additional information on assistance programs or assistance connecting with community support programs   [place] DSS  [phone]: Information regarding food stamps, Medicaid, and utility assistance [place] [phone]   [place] [employer]'s shared-ride transportation service for eligible riders who have a disability that prevents them from riding the fixed route bus.   Medicare  Rights Center 800-333-4114 Helps people with Medicare understand their rights and benefits, navigate the Medicare system, and secure the quality healthcare they deserve [employer] 800-227-2345 Assists patients locate various types of support and financial assistance Cancer Care: 1-800-813-HOPE (4673) Provides financial assistance, online support groups, medication/co-pay assistance.   Transportation Assistance for appointments at [facility]: Transportation Coordinator [phone]  Again, thank you for choosing [facility] for your care.       

## 2020-09-26 NOTE — Progress Notes (Signed)
HEMATOLOGY/ONCOLOGY CONSULTATION NOTE  Date of Service: 09/26/2020  Patient Care Team: Patient, No Pcp Per as PCP - General (General Practice)  CHIEF COMPLAINTS/PURPOSE OF CONSULTATION:  IDA  HISTORY OF PRESENTING ILLNESS:   Lori Roy is a wonderful 34 y.o. female who has been referred to [place] by Dr. Nadyne Coombes for evaluation and management of iron deficiency anemia. The pt reports that she is doing well overall.   The pt reports that she has been anemic and iron deficient for nearly 20 years. She once went to the hospital after becoming extremely weak and fatigued and was found to have a Hgb of around 7. She is unsure if she was given IV Iron, but knows that there was discussion surrounding completing a blood transfusion, but she was placed on PO Iron instead. She endorses symptom improvement after starting PO Iron. While on PO Iron the pt had diarrhea and nausea, but couldn't determine if this was caused by iron or migraines. Two years ago pt was told that she was Vitamin D deficient and was given a 50K UT supplement to take for a short time. This has not been re-evaluated since then.    Pt has been experiencing dizziness, palpitations, and shortness of breath in the last few months. Pt has had tingling in her fingers and toes for a year. Pt has minor gum bleeds after brushing her teeth. Pt does not characterize her menstrual cycles as heavy. Her menstrual cycles typically last 4-5 days with 1-2 days being heavier. Pt has three children: ages 64, 64, & 3.  She has chronic migraines and notes almost daily headaches. Pt has noticed increased severity and changes in the symptoms associated with her migraines recently. She also endorses recent vision changes. Pt has previously been evaluated by Neurology for her migraines, which did not yield any explanations. She did not notice any improvement in her migraines after healthy weight loss. Pt was previously on Topamax. She recently  started taking 1-2 200 mg Gabapentin daily and Propranolol. Pt has been on Zoloft for 8 years.    Pt has had a Cholecystectomy, a wisdom tooth extraction, and C-section. Pt had no excessive bleeding during any of her procedures. Pt denies any thyroid issues or any gluten intolerance.  Most recent lab results (08/24/2020) of CBC w/diff & CMP is as follows: all values are WNL except for Hgb at 10.1, HCT at 34.1, MCV at 78.0, MCH at 23.1, MCHC at 29.6, PLT at 423K, Mono Rel at 3.7, BUN at 5. 08/29/2020 Ferritin at 5.7  On review of systems, pt reports tingling/numbness in fingers/toes, gum bleeding, dizziness, SOB, palpitations, vision changes, migraines and denies unexpected weight loss, nose bleeds, hematuria, bloody/black stools, heavy menstrual cycles, abdominal pain, leg swelling and any other symptoms.   On PMHx the pt reports C-section, Cholecystectomy, Wisdom tooth extraction, Migraines, IDA. On Social Hx the pt reports that she is a non-smoker and does not drink much alcohol. On Family Hx the pt reports no history of bleeding or blood clotting disorders.   MEDICAL HISTORY:  Past Medical History:  Diagnosis Date  . Anxiety   . Delivery of pregnancy by cesarean section 03/27/2017  . Depression   . Iron deficiency anemia   . Migraines   . Normal pregnancy 07/12/2011  . SROM (spontaneous rupture of membranes) 07/12/2011  . SVD (spontaneous vaginal delivery) 07/13/2011    SURGICAL HISTORY: Past Surgical History:  Procedure Laterality Date  . CESAREAN SECTION WITH BILATERAL TUBAL LIGATION  N/A 03/27/2017   Procedure: CESAREAN SECTION WITH BILATERAL TUBAL LIGATION;  Surgeon: Sherian ReinBovard-Stuckert, Jody, MD;  [facility]: [facility];  Service: Obstetrics;  Laterality: N/A;  . CHOLECYSTECTOMY N/A 05/11/2013   Procedure: LAPAROSCOPIC CHOLECYSTECTOMY WITH INTRAOPERATIVE CHOLANGIOGRAM;  Surgeon: Cherylynn RidgesJames O Wyatt, MD;  Location: [facility];  Service: General;  Laterality: N/A;  . WISDOM TOOTH EXTRACTION  ~  2006    SOCIAL HISTORY: Social History   Socioeconomic History  . Marital status: Married    Spouse name: Not on file  . Number of children: Not on file  . Years of education: Not on file  . Highest education level: Not on file  Occupational History  . Not on file  Tobacco Use  . Smoking status: Former Smoker    Packs/day: 0.10    Years: 3.00    Pack years: 0.30    Types: Cigarettes    Quit date: 11/17/2010    Years since quitting: 9.8  . Smokeless tobacco: Never Used  Substance and Sexual Activity  . Alcohol use: No  . Drug use: Yes    Types: Marijuana  . Sexual activity: Yes  Other Topics Concern  . Not on file  Social History Narrative  . Not on file   Social Determinants of Health   Financial Resource Strain:   . Difficulty of Paying Living Expenses: Not on file  Food Insecurity:   . Worried About Programme researcher, broadcasting/film/videounning Out of Food in the Last Year: Not on file  . Ran Out of Food in the Last Year: Not on file  Transportation Needs:   . Lack of Transportation (Medical): Not on file  . Lack of Transportation (Non-Medical): Not on file  Physical Activity:   . Days of Exercise per Week: Not on file  . Minutes of Exercise per Session: Not on file  Stress:   . Feeling of Stress : Not on file  Social Connections:   . Frequency of Communication with Friends and Family: Not on file  . Frequency of Social Gatherings with Friends and Family: Not on file  . Attends Religious Services: Not on file  . Active Member of Clubs or Organizations: Not on file  . Attends BankerClub or Organization Meetings: Not on file  . Marital Status: Not on file  Intimate Partner Violence:   . Fear of Current or Ex-Partner: Not on file  . Emotionally Abused: Not on file  . Physically Abused: Not on file  . Sexually Abused: Not on file    FAMILY HISTORY: No family history on file.  ALLERGIES:  has No Known Allergies.  MEDICATIONS:  Current Outpatient Medications  Medication Sig Dispense Refill  .  gabapentin (NEURONTIN) 100 MG capsule     . Prenatal Vit-Fe Phos-FA-Omega (VITAFOL GUMMIES) 3.33-0.333-34.8 MG CHEW Chew by mouth.    . propranolol ER (INDERAL LA) 80 MG 24 hr capsule Take 80 mg by mouth daily.    . sertraline (ZOLOFT) 50 MG tablet Take 50 mg by mouth daily.    . Vitamin D, Ergocalciferol, (DRISDOL) 1.25 MG (50000 UT) CAPS capsule Take 1 capsule (50,000 Units total) by mouth every 7 (seven) days. 16 capsule 0  . busPIRone (BUSPAR) 10 MG tablet Take 1 tablet (10 mg total) by mouth 2 (two) times daily. (Patient not taking: Reported on 09/26/2020) 60 tablet 2  . calcium carbonate (TUMS - DOSED IN MG ELEMENTAL CALCIUM) 500 MG chewable tablet Chew 1-2 tablets by mouth 2 (two) times daily as needed for indigestion or heartburn.  (  Patient not taking: Reported on 09/26/2020)    . dicyclomine (BENTYL) 20 MG tablet Take 1 tablet (20 mg total) by mouth every 8 (eight) hours as needed for spasms (Abdominal cramping and pain). (Patient not taking: Reported on 09/26/2020) 30 tablet 0  . hydrochlorothiazide (HYDRODIURIL) 25 MG tablet Take 1 tablet (25 mg total) by mouth daily. (Patient not taking: Reported on 11/16/2018) 14 tablet 0  . ibuprofen (ADVIL,MOTRIN) 800 MG tablet Take 1 tablet (800 mg total) by mouth every 8 (eight) hours. (Patient not taking: Reported on 11/16/2018) 45 tablet 1  . iron polysaccharides (NIFEREX) 150 MG capsule Take 1 capsule (150 mg total) by mouth 2 (two) times daily. 60 capsule 3  . methocarbamol (ROBAXIN) 500 MG tablet Take 1 tablet (500 mg total) by mouth 2 (two) times daily. (Patient not taking: Reported on 09/26/2020) 20 tablet 0  . naproxen (NAPROSYN) 500 MG tablet Take 1 tablet (500 mg total) by mouth 2 (two) times daily. (Patient not taking: Reported on 09/26/2020) 30 tablet 0  . omeprazole (PRILOSEC) 40 MG capsule Take 40 mg by mouth daily as needed (for heartburn). (Patient not taking: Reported on 09/26/2020)    . ondansetron (ZOFRAN ODT) 4 MG disintegrating  tablet Take 1 tablet (4 mg total) by mouth every 6 (six) hours as needed. (Patient not taking: Reported on 09/26/2020) 30 tablet 0  . pantoprazole (PROTONIX) 40 MG tablet Take 1 tablet (40 mg total) by mouth daily. (Patient not taking: Reported on 09/26/2020) 30 tablet 1  . sertraline (ZOLOFT) 100 MG tablet Take 100 mg by mouth daily. (Patient not taking: Reported on 09/26/2020)     No current facility-administered medications for this visit.    REVIEW OF SYSTEMS:    10 Point review of Systems was done is negative except as noted above.  PHYSICAL EXAMINATION: ECOG PERFORMANCE STATUS: 1 - Symptomatic but completely ambulatory  . Vitals:   09/26/20 1318  BP: (!) 142/67  Pulse: (!) 59  Resp: 18  Temp: 97.6 F (36.4 C)  SpO2: 100%   Filed Weights   09/26/20 1318  Weight: 202 lb 14.4 oz (92 kg)   .Body mass index is 38.34 kg/m.  GENERAL:alert, in no acute distress and comfortable SKIN: no acute rashes, no significant lesions EYES: conjunctiva are pink and non-injected, sclera anicteric OROPHARYNX: MMM, no exudates, no oropharyngeal erythema or ulceration NECK: supple, no JVD LYMPH:  no palpable lymphadenopathy in the cervical, axillary or inguinal regions LUNGS: clear to auscultation b/l with normal respiratory effort HEART: regular rate & rhythm ABDOMEN:  normoactive bowel sounds , non tender, not distended. Extremity: no pedal edema PSYCH: alert & oriented x 3 with fluent speech NEURO: no focal motor/sensory deficits  LABORATORY DATA:  I have reviewed the data as listed  . CBC Latest Ref Rng & Units 09/26/2020 12/03/2019 08/24/2019  WBC 4.0 - 10.5 K/uL 12.7(H) 10.2 9.1  Hemoglobin 12.0 - 15.0 g/dL 10.8(L) 10.8(L) 11.0(L)  Hematocrit 36 - 46 % 35.8(L) 35.7(L) 35.9(L)  Platelets 150 - 400 K/uL 363 378 379    . CMP Latest Ref Rng & Units 09/26/2020 12/03/2019 08/24/2019  Glucose 70 - 99 mg/dL 798(X) 211(H) 417(E)  BUN 6 - 20 mg/dL 6 8 <0(C)  Creatinine 1.44 - 1.00  mg/dL 8.18 5.63 1.49  Sodium 135 - 145 mmol/L 138 138 139  Potassium 3.5 - 5.1 mmol/L 3.5 3.5 3.4(L)  Chloride 98 - 111 mmol/L 103 103 107  CO2 22 - 32 mmol/L 27 25 22  Calcium 8.9 - 10.3 mg/dL 8.9 8.9 9.2  Total Protein 6.5 - 8.1 g/dL 7.2 - 7.0  Total Bilirubin 0.3 - 1.2 mg/dL 0.4 - 0.3  Alkaline Phos 38 - 126 U/L 94 - 67  AST 15 - 41 U/L 11(L) - 16  ALT 0 - 44 U/L 10 - 11     RADIOGRAPHIC STUDIES: I have personally reviewed the radiological images as listed and agreed with the findings in the report. No results found.  ASSESSMENT & PLAN:   34 Yo with   1) Iron deficiency Anemia PLAN: -Discussed patient's most recent labs from 08/24/2020, Hgb is mildly low, PLT reactively elevated, chemistries are nml, Ferritin is very low. -Advised pt that we would prefer to begin with PO Iron vs IV iron, provided no significant GI symptoms with PO Iron.  -Advised pt that if she can not tolerate PO Iron we would proceed with IV Iron.  -Advised pt that the highest risk associated with IV iron would be an allergic reaction, which we premedicate against.  -Advised pt that the tingling in her extremities suggests other nutritional deficiencies.  -Recommend pt take PO Iron BID with food to prevent upset stomach. -Recommend pt f/u with Dr. Hal Hope for Neurology referral  -Will get labs today  -Rx 150 mg Iron Polysaccharide  -Will see back in 3 months with labs  2) B12 deficiency B12 -- 124 Plan Start taking B12 1000 mcg daily OTC  FOLLOW UP: Labs today RTC with Dr Candise Che with labs in 3 months   All of the patients questions were answered with apparent satisfaction. The patient knows to call the clinic with any problems, questions or concerns.  I spent 30 mins counseling the patient face to face. The total time spent in the appointment was 45 minutes and more than 50% was on counseling and direct patient cares.    Wyvonnia Lora MD MS AAHIVMS [facility] Hematology/Oncology Physician [facility]  (Office):       [phone] (Work cell):  [phone] (Fax):           [phone]  09/26/2020 5:08 PM  I, Carollee Herter, am acting as a scribe for Dr. Wyvonnia Lora.   Marland Kitchen..I have reviewed the above documentation for accuracy and completeness, and I agree with the above. Johney Maine MD

## 2020-10-08 ENCOUNTER — Telehealth: Payer: Self-pay | Admitting: *Deleted

## 2020-10-08 NOTE — Telephone Encounter (Signed)
-----   Message from Tonny Bollman, RN sent at 10/08/2020  3:14 PM EST ----- LVM for patient to call office ----- Message ----- From: Johney Maine, MD Sent: 10/03/2020  12:05 AM EST To: Tonny Bollman, RN  Plz let patient know her B12 levels are low @ 124--- recommend OTC B12 1000 microgram po daily.

## 2020-10-08 NOTE — Telephone Encounter (Signed)
Contacted patient regarding test results per Dr. Kale's directions. Patient verbalized understanding  

## 2020-12-24 ENCOUNTER — Other Ambulatory Visit: Payer: Self-pay | Admitting: *Deleted

## 2020-12-24 DIAGNOSIS — D500 Iron deficiency anemia secondary to blood loss (chronic): Secondary | ICD-10-CM

## 2020-12-25 NOTE — Progress Notes (Incomplete)
HEMATOLOGY/ONCOLOGY CONSULTATION NOTE  Date of Service: 12/25/2020  Patient Care Team: Patient, No Pcp Per as PCP - General (General Practice)  CHIEF COMPLAINTS/PURPOSE OF CONSULTATION:  IDA  HISTORY OF PRESENTING ILLNESS:   Lori Roy is a wonderful 35 y.o. female who has been referred to [place] by Dr. Nadyne Coombes for evaluation and management of iron deficiency anemia. The pt reports that she is doing well overall.   The pt reports that she has been anemic and iron deficient for nearly 20 years. She once went to the hospital after becoming extremely weak and fatigued and was found to have a Hgb of around 7. She is unsure if she was given IV Iron, but knows that there was discussion surrounding completing a blood transfusion, but she was placed on PO Iron instead. She endorses symptom improvement after starting PO Iron. While on PO Iron the pt had diarrhea and nausea, but couldn't determine if this was caused by iron or migraines. Two years ago pt was told that she was Vitamin D deficient and was given a 50K UT supplement to take for a short time. This has not been re-evaluated since then.    Pt has been experiencing dizziness, palpitations, and shortness of breath in the last few months. Pt has had tingling in her fingers and toes for a year. Pt has minor gum bleeds after brushing her teeth. Pt does not characterize her menstrual cycles as heavy. Her menstrual cycles typically last 4-5 days with 1-2 days being heavier. Pt has three children: ages 56, 66, & 3.  She has chronic migraines and notes almost daily headaches. Pt has noticed increased severity and changes in the symptoms associated with her migraines recently. She also endorses recent vision changes. Pt has previously been evaluated by Neurology for her migraines, which did not yield any explanations. She did not notice any improvement in her migraines after healthy weight loss. Pt was previously on Topamax. She recently started  taking 1-2 200 mg Gabapentin daily and Propranolol. Pt has been on Zoloft for 8 years.    Pt has had a Cholecystectomy, a wisdom tooth extraction, and C-section. Pt had no excessive bleeding during any of her procedures. Pt denies any thyroid issues or any gluten intolerance.  Most recent lab results (08/24/2020) of CBC w/diff & CMP is as follows: all values are WNL except for Hgb at 10.1, HCT at 34.1, MCV at 78.0, MCH at 23.1, MCHC at 29.6, PLT at 423K, Mono Rel at 3.7, BUN at 5. 08/29/2020 Ferritin at 5.7  On review of systems, pt reports tingling/numbness in fingers/toes, gum bleeding, dizziness, SOB, palpitations, vision changes, migraines and denies unexpected weight loss, nose bleeds, hematuria, bloody/black stools, heavy menstrual cycles, abdominal pain, leg swelling and any other symptoms.   On PMHx the pt reports C-section, Cholecystectomy, Wisdom tooth extraction, Migraines, IDA. On Social Hx the pt reports that she is a non-smoker and does not drink much alcohol. On Family Hx the pt reports no history of bleeding or blood clotting disorders.  INTERVAL HISTORY Lori Roy is a 35 y.o. female who is here today for evaluation and management of iron deficiency anemia. The patient's last visit with [place] was on 09/26/2020. The pt reports that she is doing well overall.  The pt reports ***  Lab results today 12/26/2020 of CBC w/diff and CMP is as follows: all values are WNL except for ***  On review of systems, pt reports *** and denies *** and  any other symptoms.  MEDICAL HISTORY:  Past Medical History:  Diagnosis Date  . Anxiety   . Delivery of pregnancy by cesarean section 03/27/2017  . Depression   . Iron deficiency anemia   . Migraines   . Normal pregnancy 07/12/2011  . SROM (spontaneous rupture of membranes) 07/12/2011  . SVD (spontaneous vaginal delivery) 07/13/2011    SURGICAL HISTORY: Past Surgical History:  Procedure Laterality Date  . CESAREAN SECTION WITH  BILATERAL TUBAL LIGATION N/A 03/27/2017   Procedure: CESAREAN SECTION WITH BILATERAL TUBAL LIGATION;  Surgeon: Sherian Rein, MD;  [facility]: [facility];  Service: Obstetrics;  Laterality: N/A;  . CHOLECYSTECTOMY N/A 05/11/2013   Procedure: LAPAROSCOPIC CHOLECYSTECTOMY WITH INTRAOPERATIVE CHOLANGIOGRAM;  Surgeon: Cherylynn Ridges, MD;  Location: [facility];  Service: General;  Laterality: N/A;  . WISDOM TOOTH EXTRACTION  ~ 2006    SOCIAL HISTORY: Social History   Socioeconomic History  . Marital status: Married    Spouse name: Not on file  . Number of children: Not on file  . Years of education: Not on file  . Highest education level: Not on file  Occupational History  . Not on file  Tobacco Use  . Smoking status: Former Smoker    Packs/day: 0.10    Years: 3.00    Pack years: 0.30    Types: Cigarettes    Quit date: 11/17/2010    Years since quitting: 10.1  . Smokeless tobacco: Never Used  Substance and Sexual Activity  . Alcohol use: No  . Drug use: Yes    Types: Marijuana  . Sexual activity: Yes  Other Topics Concern  . Not on file  Social History Narrative  . Not on file   Social Determinants of Health   Financial Resource Strain: Not on file  Food Insecurity: Not on file  Transportation Needs: Not on file  Physical Activity: Not on file  Stress: Not on file  Social Connections: Not on file  Intimate Partner Violence: Not on file    FAMILY HISTORY: No family history on file.  ALLERGIES:  has No Known Allergies.  MEDICATIONS:  Current Outpatient Medications  Medication Sig Dispense Refill  . busPIRone (BUSPAR) 10 MG tablet Take 1 tablet (10 mg total) by mouth 2 (two) times daily. (Patient not taking: Reported on 09/26/2020) 60 tablet 2  . calcium carbonate (TUMS - DOSED IN MG ELEMENTAL CALCIUM) 500 MG chewable tablet Chew 1-2 tablets by mouth 2 (two) times daily as needed for indigestion or heartburn.  (Patient not taking: Reported on 09/26/2020)    .  dicyclomine (BENTYL) 20 MG tablet Take 1 tablet (20 mg total) by mouth every 8 (eight) hours as needed for spasms (Abdominal cramping and pain). (Patient not taking: Reported on 09/26/2020) 30 tablet 0  . gabapentin (NEURONTIN) 100 MG capsule     . hydrochlorothiazide (HYDRODIURIL) 25 MG tablet Take 1 tablet (25 mg total) by mouth daily. (Patient not taking: Reported on 11/16/2018) 14 tablet 0  . ibuprofen (ADVIL,MOTRIN) 800 MG tablet Take 1 tablet (800 mg total) by mouth every 8 (eight) hours. (Patient not taking: Reported on 11/16/2018) 45 tablet 1  . iron polysaccharides (NIFEREX) 150 MG capsule Take 1 capsule (150 mg total) by mouth 2 (two) times daily. 60 capsule 3  . methocarbamol (ROBAXIN) 500 MG tablet Take 1 tablet (500 mg total) by mouth 2 (two) times daily. (Patient not taking: Reported on 09/26/2020) 20 tablet 0  . naproxen (NAPROSYN) 500 MG tablet Take 1 tablet (500  mg total) by mouth 2 (two) times daily. (Patient not taking: Reported on 09/26/2020) 30 tablet 0  . omeprazole (PRILOSEC) 40 MG capsule Take 40 mg by mouth daily as needed (for heartburn). (Patient not taking: Reported on 09/26/2020)    . ondansetron (ZOFRAN ODT) 4 MG disintegrating tablet Take 1 tablet (4 mg total) by mouth every 6 (six) hours as needed. (Patient not taking: Reported on 09/26/2020) 30 tablet 0  . pantoprazole (PROTONIX) 40 MG tablet Take 1 tablet (40 mg total) by mouth daily. (Patient not taking: Reported on 09/26/2020) 30 tablet 1  . Prenatal Vit-Fe Phos-FA-Omega (VITAFOL GUMMIES) 3.33-0.333-34.8 MG CHEW Chew by mouth.    . propranolol ER (INDERAL LA) 80 MG 24 hr capsule Take 80 mg by mouth daily.    . sertraline (ZOLOFT) 100 MG tablet Take 100 mg by mouth daily. (Patient not taking: Reported on 09/26/2020)    . sertraline (ZOLOFT) 50 MG tablet Take 50 mg by mouth daily.    . Vitamin D, Ergocalciferol, (DRISDOL) 1.25 MG (50000 UT) CAPS capsule Take 1 capsule (50,000 Units total) by mouth every 7 (seven) days.  16 capsule 0   No current facility-administered medications for this visit.    REVIEW OF SYSTEMS:   10 Point review of Systems was done is negative except as noted above.  PHYSICAL EXAMINATION: ECOG PERFORMANCE STATUS: 1 - Symptomatic but completely ambulatory  . There were no vitals filed for this visit. There were no vitals filed for this visit. .There is no height or weight on file to calculate BMI.  *** GENERAL:alert, in no acute distress and comfortable SKIN: no acute rashes, no significant lesions EYES: conjunctiva are pink and non-injected, sclera anicteric OROPHARYNX: MMM, no exudates, no oropharyngeal erythema or ulceration NECK: supple, no JVD LYMPH:  no palpable lymphadenopathy in the cervical, axillary or inguinal regions LUNGS: clear to auscultation b/l with normal respiratory effort HEART: regular rate & rhythm ABDOMEN:  normoactive bowel sounds , non tender, not distended. Extremity: no pedal edema PSYCH: alert & oriented x 3 with fluent speech NEURO: no focal motor/sensory deficits   LABORATORY DATA:  I have reviewed the data as listed  . CBC Latest Ref Rng & Units 09/26/2020 12/03/2019 08/24/2019  WBC 4.0 - 10.5 K/uL 12.7(H) 10.2 9.1  Hemoglobin 12.0 - 15.0 g/dL 10.8(L) 10.8(L) 11.0(L)  Hematocrit 36.0 - 46.0 % 35.8(L) 35.7(L) 35.9(L)  Platelets 150 - 400 K/uL 363 378 379    . CMP Latest Ref Rng & Units 09/26/2020 12/03/2019 08/24/2019  Glucose 70 - 99 mg/dL 026(V) 785(Y) 850(Y)  BUN 6 - 20 mg/dL 6 8 <7(X)  Creatinine 4.12 - 1.00 mg/dL 8.78 6.76 7.20  Sodium 135 - 145 mmol/L 138 138 139  Potassium 3.5 - 5.1 mmol/L 3.5 3.5 3.4(L)  Chloride 98 - 111 mmol/L 103 103 107  CO2 22 - 32 mmol/L 27 25 22   Calcium 8.9 - 10.3 mg/dL 8.9 8.9 9.2  Total Protein 6.5 - 8.1 g/dL 7.2 - 7.0  Total Bilirubin 0.3 - 1.2 mg/dL 0.4 - 0.3  Alkaline Phos 38 - 126 U/L 94 - 67  AST 15 - 41 U/L 11(L) - 16  ALT 0 - 44 U/L 10 - 11     RADIOGRAPHIC STUDIES: I have personally  reviewed the radiological images as listed and agreed with the findings in the report. No results found.  ASSESSMENT & PLAN:   35 Yo with   1) Iron deficiency Anemia PLAN: -Discussed patient's most recent labs from  12/26/2020; ***   -Rx 150 mg Iron Polysaccharide  -Will see back***  2) B12 deficiency B12 -- 124 Plan Start taking B12 1000 mcg daily OTC  FOLLOW UP: ***   All of the patients questions were answered with apparent satisfaction. The patient knows to call the clinic with any problems, questions or concerns.  I spent 30 mins counseling the patient face to face. The total time spent in the appointment was 45 minutes and more than 50% was on counseling and direct patient cares.    Wyvonnia Lora MD MS AAHIVMS [facility] Hematology/Oncology Physician [facility]  (Office):       [phone] (Work cell):  [phone] (Fax):           [phone]  12/25/2020 5:55 PM  I, Minda Meo, am acting as scribe for Dr. Wyvonnia Lora, MD.

## 2020-12-26 ENCOUNTER — Inpatient Hospital Stay: Payer: 59 | Attending: Hematology

## 2020-12-26 ENCOUNTER — Inpatient Hospital Stay: Payer: 59 | Admitting: Hematology

## 2023-05-14 ENCOUNTER — Emergency Department (HOSPITAL_COMMUNITY)
Admission: EM | Admit: 2023-05-14 | Discharge: 2023-05-15 | Disposition: A | Discharge status: Home / Self Care | Payer: Self-pay | Attending: Emergency Medicine | Admitting: Emergency Medicine

## 2023-05-14 ENCOUNTER — Other Ambulatory Visit: Payer: Self-pay

## 2023-05-14 DIAGNOSIS — R0602 Shortness of breath: Secondary | ICD-10-CM | POA: Insufficient documentation

## 2023-05-14 DIAGNOSIS — R072 Precordial pain: Secondary | ICD-10-CM | POA: Insufficient documentation

## 2023-05-14 DIAGNOSIS — D72829 Elevated white blood cell count, unspecified: Secondary | ICD-10-CM | POA: Insufficient documentation

## 2023-05-14 DIAGNOSIS — R1012 Left upper quadrant pain: Secondary | ICD-10-CM | POA: Insufficient documentation

## 2023-05-14 DIAGNOSIS — R1032 Left lower quadrant pain: Secondary | ICD-10-CM | POA: Insufficient documentation

## 2023-05-14 DIAGNOSIS — R112 Nausea with vomiting, unspecified: Secondary | ICD-10-CM | POA: Insufficient documentation

## 2023-05-14 DIAGNOSIS — R109 Unspecified abdominal pain: Secondary | ICD-10-CM

## 2023-05-14 DIAGNOSIS — E876 Hypokalemia: Secondary | ICD-10-CM

## 2023-05-14 NOTE — ED Triage Notes (Signed)
Pt arrives to ED c/o epigastric ABD pain that radiates to to mid back. Pt also endorses substernal chest pain. Pt reports nausea, dizziness.

## 2023-05-15 ENCOUNTER — Emergency Department (HOSPITAL_COMMUNITY): Payer: Self-pay

## 2023-05-15 LAB — URINALYSIS, ROUTINE W REFLEX MICROSCOPIC
Bilirubin Urine: NEGATIVE
Glucose, UA: NEGATIVE mg/dL
Ketones, ur: NEGATIVE mg/dL
Nitrite: NEGATIVE
Protein, ur: 100 mg/dL — AB
Specific Gravity, Urine: 1.001 — ABNORMAL LOW (ref 1.005–1.030)
pH: 7 (ref 5.0–8.0)

## 2023-05-15 LAB — CBC
HCT: 36.4 % (ref 36.0–46.0)
Hemoglobin: 11.4 g/dL — ABNORMAL LOW (ref 12.0–15.0)
MCH: 26 pg (ref 26.0–34.0)
MCHC: 31.3 g/dL (ref 30.0–36.0)
MCV: 82.9 fL (ref 80.0–100.0)
Platelets: 335 10*3/uL (ref 150–400)
RBC: 4.39 MIL/uL (ref 3.87–5.11)
RDW: 21.9 % — ABNORMAL HIGH (ref 11.5–15.5)
WBC: 11.3 10*3/uL — ABNORMAL HIGH (ref 4.0–10.5)
nRBC: 0 % (ref 0.0–0.2)

## 2023-05-15 LAB — HCG, QUANTITATIVE, PREGNANCY: hCG, Beta Chain, Quant, S: 1 m[IU]/mL (ref <5)

## 2023-05-15 LAB — TROPONIN I (HIGH SENSITIVITY)
Troponin I (High Sensitivity): 5 ng/L (ref <18)
Troponin I (High Sensitivity): 4 ng/L (ref <18)

## 2023-05-15 LAB — COMPREHENSIVE METABOLIC PANEL
ALT: 9 U/L (ref 0–44)
AST: 14 U/L — ABNORMAL LOW (ref 15–41)
Albumin: 3.8 g/dL (ref 3.5–5.0)
Alkaline Phosphatase: 72 U/L (ref 38–126)
Anion gap: 18 — ABNORMAL HIGH (ref 5–15)
BUN: <5 mg/dL — ABNORMAL LOW (ref 6–20)
CO2: 19 mmol/L — ABNORMAL LOW (ref 22–32)
Calcium: 9.3 mg/dL (ref 8.9–10.3)
Chloride: 100 mmol/L (ref 98–111)
Creatinine, Ser: 0.75 mg/dL (ref 0.44–1.00)
GFR, Estimated: >60 mL/min (ref >60)
Glucose, Bld: 112 mg/dL — ABNORMAL HIGH (ref 70–99)
Potassium: 2.7 mmol/L — CL (ref 3.5–5.1)
Sodium: 137 mmol/L (ref 135–145)
Total Bilirubin: 0.5 mg/dL (ref 0.3–1.2)
Total Protein: 7.2 g/dL (ref 6.5–8.1)

## 2023-05-15 LAB — LIPASE, BLOOD: Lipase: 27 U/L (ref 11–51)

## 2023-05-15 LAB — MAGNESIUM: Magnesium: 1.5 mg/dL — ABNORMAL LOW (ref 1.7–2.4)

## 2023-05-15 MED ORDER — POTASSIUM CHLORIDE CRYS ER 20 MEQ PO TBCR
40.0000 meq | EXTENDED_RELEASE_TABLET | Freq: Once | ORAL | Status: AC
  Administered 2023-05-15: 40 meq via ORAL
  Filled 2023-05-15: qty 2

## 2023-05-15 MED ORDER — ONDANSETRON HCL 4 MG/2ML IJ SOLN
4.0000 mg | Freq: Once | INTRAMUSCULAR | Status: AC
  Administered 2023-05-15: 4 mg via INTRAVENOUS
  Filled 2023-05-15: qty 2

## 2023-05-15 MED ORDER — IOHEXOL 350 MG/ML SOLN
75.0000 mL | Freq: Once | INTRAVENOUS | Status: AC | PRN
  Administered 2023-05-15: 75 mL via INTRAVENOUS

## 2023-05-15 MED ORDER — MORPHINE SULFATE (PF) 4 MG/ML IV SOLN
4.0000 mg | Freq: Once | INTRAVENOUS | Status: AC
  Administered 2023-05-15: 4 mg via INTRAVENOUS
  Filled 2023-05-15: qty 1

## 2023-05-15 MED ORDER — POTASSIUM CHLORIDE 10 MEQ/100ML IV SOLN
10.0000 meq | Freq: Once | INTRAVENOUS | Status: AC
  Administered 2023-05-15: 10 meq via INTRAVENOUS
  Filled 2023-05-15: qty 100

## 2023-05-15 MED ORDER — SODIUM CHLORIDE 0.9 % IV BOLUS
1000.0000 mL | Freq: Once | INTRAVENOUS | Status: AC
  Administered 2023-05-15: 1000 mL via INTRAVENOUS

## 2023-05-15 MED ORDER — MAGNESIUM SULFATE 2 GM/50ML IV SOLN
2.0000 g | Freq: Once | INTRAVENOUS | Status: AC
  Administered 2023-05-15: 2 g via INTRAVENOUS
  Filled 2023-05-15: qty 50

## 2023-05-15 NOTE — ED Provider Notes (Signed)
[place] EMERGENCY DEPARTMENT AT [facility] Provider Note   CSN: [id number] Arrival date & time: 05/14/23  2325     History  Chief Complaint  Patient presents with   Abdominal Pain   Chest Pain    Lori Roy is a 37 y.o. female.  The history is provided by the patient and medical records.  Abdominal Pain Associated symptoms: chest pain   Chest Pain Associated symptoms: abdominal pain    37 year old female with history of iron deficiency anemia, presenting to the ED with abdominal pain.  States for the past 2 weeks she has been having left-sided abdominal pain, upper and lower.  Over the past 24 hours this has significantly worsened and feels it radiating into her back.  She has had associated nausea and vomiting.  Also started having some chest pain today in substernal region.  States it felt like pressure.  Also has some shortness of breath, unsure if this is pain related.  She denies any fever or chills.  No urinary symptoms.  She has had prior C-section and cholecystectomy.  Home Medications Prior to Admission medications   Medication Sig Start Date End Date Taking? Authorizing Provider  busPIRone (BUSPAR) 10 MG tablet Take 1 tablet (10 mg total) by mouth 2 (two) times daily. Patient not taking: Reported on 09/26/2020 12/02/18   Anders Simmonds, PA-C  calcium carbonate (TUMS - DOSED IN MG ELEMENTAL CALCIUM) 500 MG chewable tablet Chew 1-2 tablets by mouth 2 (two) times daily as needed for indigestion or heartburn.  Patient not taking: Reported on 09/26/2020    [provider]  dicyclomine (BENTYL) 20 MG tablet Take 1 tablet (20 mg total) by mouth every 8 (eight) hours as needed for spasms (Abdominal cramping and pain). Patient not taking: Reported on 09/26/2020 08/25/19   Ward, Layla Maw, DO  gabapentin (NEURONTIN) 100 MG capsule  09/07/20   [provider]  hydrochlorothiazide (HYDRODIURIL) 25 MG tablet Take 1 tablet (25 mg total) by  mouth daily. Patient not taking: Reported on 11/16/2018 06/13/17   Anselm Pancoast, PA-C  ibuprofen (ADVIL,MOTRIN) 800 MG tablet Take 1 tablet (800 mg total) by mouth every 8 (eight) hours. Patient not taking: Reported on 11/16/2018 03/29/17   Bovard-Stuckert, Augusto Gamble, MD  iron polysaccharides (NIFEREX) 150 MG capsule Take 1 capsule (150 mg total) by mouth 2 (two) times daily. 09/26/20   Johney Maine, MD  methocarbamol (ROBAXIN) 500 MG tablet Take 1 tablet (500 mg total) by mouth 2 (two) times daily. Patient not taking: Reported on 09/26/2020 12/03/19   Dartha Lodge, PA-C  naproxen (NAPROSYN) 500 MG tablet Take 1 tablet (500 mg total) by mouth 2 (two) times daily. Patient not taking: Reported on 09/26/2020 12/03/19   Dartha Lodge, PA-C  omeprazole (PRILOSEC) 40 MG capsule Take 40 mg by mouth daily as needed (for heartburn). Patient not taking: Reported on 09/26/2020    [provider]  ondansetron (ZOFRAN ODT) 4 MG disintegrating tablet Take 1 tablet (4 mg total) by mouth every 6 (six) hours as needed. Patient not taking: Reported on 09/26/2020 08/25/19   Ward, Layla Maw, DO  pantoprazole (PROTONIX) 40 MG tablet Take 1 tablet (40 mg total) by mouth daily. Patient not taking: Reported on 09/26/2020 08/25/19   Ward, Layla Maw, DO  Prenatal Vit-Fe Phos-FA-Omega (VITAFOL GUMMIES) 3.33-0.333-34.8 MG CHEW Chew by mouth. 09/06/20   [provider]  propranolol ER (INDERAL LA) 80 MG 24 hr capsule Take 80 mg by  mouth daily. 09/07/20   [provider]  sertraline (ZOLOFT) 100 MG tablet Take 100 mg by mouth daily. Patient not taking: Reported on 09/26/2020    [provider]  sertraline (ZOLOFT) 50 MG tablet Take 50 mg by mouth daily. 09/07/20   [provider]  Vitamin D, Ergocalciferol, (DRISDOL) 1.25 MG (50000 UT) CAPS capsule Take 1 capsule (50,000 Units total) by mouth every 7 (seven) days. 12/07/18   Anders Simmonds, PA-C      Allergies    Patient has  no known allergies.    Review of Systems   Review of Systems  Cardiovascular:  Positive for chest pain.  Gastrointestinal:  Positive for abdominal pain.  All other systems reviewed and are negative.   Physical Exam Updated Vital Signs BP (!) 142/82   Pulse 69   Temp 97.8 F (36.6 C) (Oral)   Resp 14   Ht 5\' 1"  (1.549 m)   Wt 82.6 kg   SpO2 100%   BMI 34.39 kg/m   Physical Exam Vitals and nursing note reviewed.  Constitutional:      Appearance: She is well-developed.  HENT:     Head: Normocephalic and atraumatic.  Eyes:     Conjunctiva/sclera: Conjunctivae normal.     Pupils: Pupils are equal, round, and reactive to light.  Cardiovascular:     Rate and Rhythm: Normal rate and regular rhythm.     Heart sounds: Normal heart sounds.  Pulmonary:     Effort: Pulmonary effort is normal.     Breath sounds: Normal breath sounds.  Abdominal:     General: Bowel sounds are normal.     Palpations: Abdomen is soft.     Tenderness: There is abdominal tenderness in the left upper quadrant.    Musculoskeletal:        General: Normal range of motion.     Cervical back: Normal range of motion.  Skin:    General: Skin is warm and dry.  Neurological:     Mental Status: She is alert and oriented to person, place, and time.     ED Results / Procedures / Treatments   Labs (all labs ordered are listed, but only abnormal results are displayed) Labs Reviewed  CBC - Abnormal; Notable for the following components:      Result Value   WBC 11.3 (*)    Hemoglobin 11.4 (*)    RDW 21.9 (*)    All other components within normal limits  URINALYSIS, ROUTINE W REFLEX MICROSCOPIC - Abnormal; Notable for the following components:   Color, Urine AMBER (*)    Specific Gravity, Urine 1.001 (*)    Hgb urine dipstick LARGE (*)    Protein, ur 100 (*)    Leukocytes,Ua TRACE (*)    Bacteria, UA RARE (*)    All other components within normal limits  COMPREHENSIVE METABOLIC PANEL - Abnormal;  Notable for the following components:   Potassium 2.7 (*)    CO2 19 (*)    Glucose, Bld 112 (*)    BUN <5 (*)    AST 14 (*)    Anion gap 18 (*)    All other components within normal limits  MAGNESIUM - Abnormal; Notable for the following components:   Magnesium 1.5 (*)    All other components within normal limits  HCG, QUANTITATIVE, PREGNANCY  LIPASE, BLOOD  TROPONIN I (HIGH SENSITIVITY)  TROPONIN I (HIGH SENSITIVITY)    EKG EKG Interpretation Date/Time:  Friday May 15 2023  00:03:59 EDT Ventricular Rate:  78 PR Interval:  112 QRS Duration:  80 QT Interval:  430 QTC Calculation: 490 R Axis:   18  Text Interpretation: Normal sinus rhythm Prolonged QT Confirmed by Palumbo, April ([id number]) on 05/15/2023 1:26:30 AM  Radiology CT ABDOMEN PELVIS W CONTRAST  Result Date: 05/15/2023 CLINICAL DATA:  Epigastric pain. EXAM: CT ABDOMEN AND PELVIS WITH CONTRAST TECHNIQUE: Multidetector CT imaging of the abdomen and pelvis was performed using the standard protocol following bolus administration of intravenous contrast. RADIATION DOSE REDUCTION: This exam was performed according to the departmental dose-optimization program which includes automated exposure control, adjustment of the mA and/or kV according to patient size and/or use of iterative reconstruction technique. CONTRAST:  75mL OMNIPAQUE IOHEXOL 350 MG/ML SOLN COMPARISON:  May 17, 2012 FINDINGS: Lower chest: No acute abnormality. Hepatobiliary: No focal liver abnormality is seen. Status post cholecystectomy. No biliary dilatation. Pancreas: Unremarkable. No pancreatic ductal dilatation or surrounding inflammatory changes. Spleen: Normal in size without focal abnormality. Adrenals/Urinary Tract: Adrenal glands are unremarkable. Kidneys are normal, without renal calculi, focal lesion, or hydronephrosis. Bladder is unremarkable. Stomach/Bowel: Stomach is within normal limits. Appendix appears normal. No evidence of bowel wall thickening,  distention, or inflammatory changes. Vascular/Lymphatic: No significant vascular findings are present. No enlarged abdominal or pelvic lymph nodes. Reproductive: Uterus and bilateral adnexa are unremarkable. Other: No abdominal wall hernia or abnormality. No abdominopelvic ascites. Musculoskeletal: Left breast soft tissue is not identified. No acute or significant osseous findings. IMPRESSION: 1. Evidence of prior cholecystectomy. 2. No acute or active process within the abdomen or pelvis. Electronically Signed   By: Aram Candela M.D.   On: 05/15/2023 01:57   DG Chest 2 View  Result Date: 05/15/2023 CLINICAL DATA:  Chest pain. EXAM: CHEST - 2 VIEW COMPARISON:  December 03, 2019 FINDINGS: The heart size and mediastinal contours are within normal limits. Both lungs are clear. Radiopaque surgical clips are seen within the right upper quadrant. The visualized skeletal structures are unremarkable. IMPRESSION: No active cardiopulmonary disease. Electronically Signed   By: Aram Candela M.D.   On: 05/15/2023 00:42    Procedures Procedures    Medications Ordered in ED Medications  morphine (PF) 4 MG/ML injection 4 mg (4 mg Intravenous Given 05/15/23 0105)  ondansetron (ZOFRAN) injection 4 mg (4 mg Intravenous Given 05/15/23 0105)  sodium chloride 0.9 % bolus 1,000 mL (0 mLs Intravenous Stopped 05/15/23 0302)  potassium chloride SA (KLOR-CON M) CR tablet 40 mEq (40 mEq Oral Given 05/15/23 0154)  potassium chloride 10 mEq in 100 mL IVPB (0 mEq Intravenous Stopped 05/15/23 0302)  iohexol (OMNIPAQUE) 350 MG/ML injection 75 mL (75 mLs Intravenous Contrast Given 05/15/23 0146)  magnesium sulfate IVPB 2 g 50 mL (2 g Intravenous New Bag/Given 05/15/23 0320)    ED Course/ Medical Decision Making/ A&P                             Medical Decision Making Amount and/or Complexity of Data Reviewed Labs: ordered. Radiology: ordered and independent interpretation performed. ECG/medicine tests: ordered and  independent interpretation performed.  Risk Prescription drug management.   37 year old female here with abdominal pain for the past 2 weeks and new chest pain.  She endorses pain in her left upper and lower abdomen.  Some nausea and vomiting today.  No diarrhea.  Afebrile, nontoxic in appearance.  She seems more tender left upper and lateral abdomen.  No distinct peritoneal signs.  Labs  were obtained from triage-- WBC 11.3, K+ low at 2.7, Mg+ also low at 1.5.  normal lipase.  Given IV and PO electrolyte replacement.  CXR clear.  Trop negative.  UA with large blood, currently on menstrual cycle.  CT pending.  CT without acute findings.  Results discussed with her, voiced understanding.  After talking, it seems she has been having GI issues for quite a bit longer than originally told to me.  No family hx of IBD or similar.  Will refer to GI for further work-up, can follow-up with PCP in the interim.  Return here for new concerns.  Final Clinical Impression(s) / ED Diagnoses Final diagnoses:  Abdominal pain, unspecified abdominal location  Hypokalemia  Hypomagnesemia    Rx / DC Orders ED Discharge Orders     None         Garlon Hatchet, PA-C 05/15/23 0436    Palumbo, April, MD 05/15/23 [phone]

## 2023-05-15 NOTE — ED Notes (Signed)
Patient transported to CT 

## 2023-05-15 NOTE — ED Notes (Signed)
Patient ambulated to bathroom.

## 2023-05-15 NOTE — Discharge Instructions (Addendum)
Your CT today did not show any acute findings.  Your potassium and magnesium were low which we have replaced.  Try to eat/drink regularly as you are able. We do recommend that you follow-up with GI-- can call to get this scheduled. Follow-up with your primary care doctor in the interim. Return here for new concerns.

## 2023-09-16 ENCOUNTER — Emergency Department (HOSPITAL_COMMUNITY): Payer: Medicaid Other

## 2023-09-16 ENCOUNTER — Encounter: Payer: Self-pay | Admitting: *Deleted

## 2023-09-16 ENCOUNTER — Emergency Department (HOSPITAL_COMMUNITY)
Admission: EM | Admit: 2023-09-16 | Discharge: 2023-09-17 | Discharge status: Against Medical Advice | Payer: Medicaid Other | Attending: Emergency Medicine | Admitting: Emergency Medicine

## 2023-09-16 ENCOUNTER — Ambulatory Visit
Admission: EM | Admit: 2023-09-16 | Discharge: 2023-09-16 | Disposition: A | Discharge status: Home / Self Care | Payer: Medicaid Other

## 2023-09-16 ENCOUNTER — Encounter (HOSPITAL_COMMUNITY): Payer: Self-pay

## 2023-09-16 ENCOUNTER — Other Ambulatory Visit: Payer: Self-pay

## 2023-09-16 DIAGNOSIS — H538 Other visual disturbances: Secondary | ICD-10-CM | POA: Diagnosis not present

## 2023-09-16 DIAGNOSIS — Z5321 Procedure and treatment not carried out due to patient leaving prior to being seen by health care provider: Secondary | ICD-10-CM | POA: Diagnosis not present

## 2023-09-16 DIAGNOSIS — R519 Headache, unspecified: Secondary | ICD-10-CM | POA: Insufficient documentation

## 2023-09-16 LAB — CBC WITH DIFFERENTIAL/PLATELET
Abs Immature Granulocytes: 0.02 10*3/uL (ref 0.00–0.07)
Basophils Absolute: 0.1 10*3/uL (ref 0.0–0.1)
Basophils Relative: 1 %
Eosinophils Absolute: 0.2 10*3/uL (ref 0.0–0.5)
Eosinophils Relative: 2 %
HCT: 37.2 % (ref 36.0–46.0)
Hemoglobin: 11.1 g/dL — ABNORMAL LOW (ref 12.0–15.0)
Immature Granulocytes: 0 %
Lymphocytes Relative: 36 %
Lymphs Abs: 3.6 10*3/uL (ref 0.7–4.0)
MCH: 24.9 pg — ABNORMAL LOW (ref 26.0–34.0)
MCHC: 29.8 g/dL — ABNORMAL LOW (ref 30.0–36.0)
MCV: 83.4 fL (ref 80.0–100.0)
Monocytes Absolute: 0.4 10*3/uL (ref 0.1–1.0)
Monocytes Relative: 4 %
Neutro Abs: 5.8 10*3/uL (ref 1.7–7.7)
Neutrophils Relative %: 57 %
Platelets: 376 10*3/uL (ref 150–400)
RBC: 4.46 MIL/uL (ref 3.87–5.11)
RDW: 19.6 % — ABNORMAL HIGH (ref 11.5–15.5)
WBC: 10 10*3/uL (ref 4.0–10.5)
nRBC: 0 % (ref 0.0–0.2)

## 2023-09-16 LAB — BASIC METABOLIC PANEL
Anion gap: 12 (ref 5–15)
BUN: 9 mg/dL (ref 6–20)
CO2: 19 mmol/L — ABNORMAL LOW (ref 22–32)
Calcium: 9.5 mg/dL (ref 8.9–10.3)
Chloride: 106 mmol/L (ref 98–111)
Creatinine, Ser: 0.74 mg/dL (ref 0.44–1.00)
GFR, Estimated: >60 mL/min (ref >60)
Glucose, Bld: 95 mg/dL (ref 70–99)
Potassium: 3.5 mmol/L (ref 3.5–5.1)
Sodium: 137 mmol/L (ref 135–145)

## 2023-09-16 LAB — HCG, SERUM, QUALITATIVE: Preg, Serum: NEGATIVE

## 2023-09-16 NOTE — ED Notes (Signed)
Patient is being discharged from the Urgent Care and sent to the Emergency Department via POV . Per Erma Pinto, PA-C, patient is in need of higher level of care due to severe headache and HTN. Patient is aware and verbalizes understanding of plan of care.  Vitals:   09/16/23 1626  BP: (!) 189/84  Pulse: 73  Resp: 18  Temp: 98.2 F (36.8 C)  SpO2: 99%

## 2023-09-16 NOTE — ED Triage Notes (Addendum)
Pt reports severe headache x 1 week. Endorses light sensitivity, n/v, blurred vision in right eye. Reports neck pain when she turns her head. States she has had difficulty with her words this week as well. States "sick feeling all day long". She has been using tylenol, aleve, robaxin, and zofran without relief. She drove herself here

## 2023-09-16 NOTE — ED Triage Notes (Signed)
Pt is coming in with a headache she has had for 1 week, she states that it has caused her some vision problems such as blurry vision bilaterally and sometimes feels like she is looking through a mirage. She has taken tylenol and aleve at home with no effect. Pt has no other neurological symptoms at this time. She did go to UC and they sent her here for further eval.

## 2023-09-16 NOTE — ED Provider Notes (Signed)
Patient presents today for evaluation of severed headache, blurred vision in right eye that has been worsening for the last week. Recommended further evaluation in the ED. She is agreeable and sister to transport via POV. Patient declines EMS transport.    Tomi Bamberger, PA-C 09/16/23 1705

## 2023-09-17 NOTE — ED Notes (Signed)
NA for room assignment.  

## 2024-04-11 ENCOUNTER — Emergency Department (HOSPITAL_BASED_OUTPATIENT_CLINIC_OR_DEPARTMENT_OTHER): Admitting: Radiology

## 2024-04-11 ENCOUNTER — Encounter (HOSPITAL_BASED_OUTPATIENT_CLINIC_OR_DEPARTMENT_OTHER): Payer: Self-pay | Admitting: Emergency Medicine

## 2024-04-11 ENCOUNTER — Other Ambulatory Visit: Payer: Self-pay

## 2024-04-11 ENCOUNTER — Emergency Department (HOSPITAL_BASED_OUTPATIENT_CLINIC_OR_DEPARTMENT_OTHER)

## 2024-04-11 ENCOUNTER — Emergency Department (HOSPITAL_BASED_OUTPATIENT_CLINIC_OR_DEPARTMENT_OTHER)
Admission: EM | Admit: 2024-04-11 | Discharge: 2024-04-11 | Disposition: A | Discharge status: Home / Self Care | Attending: Emergency Medicine | Admitting: Emergency Medicine

## 2024-04-11 DIAGNOSIS — R1012 Left upper quadrant pain: Secondary | ICD-10-CM | POA: Insufficient documentation

## 2024-04-11 DIAGNOSIS — L239 Allergic contact dermatitis, unspecified cause: Secondary | ICD-10-CM | POA: Diagnosis not present

## 2024-04-11 DIAGNOSIS — R1013 Epigastric pain: Secondary | ICD-10-CM | POA: Diagnosis not present

## 2024-04-11 DIAGNOSIS — R079 Chest pain, unspecified: Secondary | ICD-10-CM | POA: Diagnosis not present

## 2024-04-11 DIAGNOSIS — R110 Nausea: Secondary | ICD-10-CM | POA: Diagnosis not present

## 2024-04-11 LAB — COMPREHENSIVE METABOLIC PANEL WITH GFR
ALT: 15 U/L (ref 0–44)
AST: 16 U/L (ref 15–41)
Albumin: 4.4 g/dL (ref 3.5–5.0)
Alkaline Phosphatase: 77 U/L (ref 38–126)
Anion gap: 15 (ref 5–15)
BUN: 9 mg/dL (ref 6–20)
CO2: 24 mmol/L (ref 22–32)
Calcium: 10 mg/dL (ref 8.9–10.3)
Chloride: 104 mmol/L (ref 98–111)
Creatinine, Ser: 0.7 mg/dL (ref 0.44–1.00)
GFR, Estimated: >60 mL/min (ref >60)
Glucose, Bld: 97 mg/dL (ref 70–99)
Potassium: 3.7 mmol/L (ref 3.5–5.1)
Sodium: 143 mmol/L (ref 135–145)
Total Bilirubin: 0.5 mg/dL (ref 0.0–1.2)
Total Protein: 7.4 g/dL (ref 6.5–8.1)

## 2024-04-11 LAB — URINALYSIS, ROUTINE W REFLEX MICROSCOPIC
Bacteria, UA: NONE SEEN
Bilirubin Urine: NEGATIVE
Glucose, UA: NEGATIVE mg/dL
Hgb urine dipstick: NEGATIVE
Ketones, ur: 40 mg/dL — AB
Nitrite: NEGATIVE
Protein, ur: NEGATIVE mg/dL
Specific Gravity, Urine: 1.013 (ref 1.005–1.030)
pH: 6.5 (ref 5.0–8.0)

## 2024-04-11 LAB — CBC
HCT: 36.6 % (ref 36.0–46.0)
Hemoglobin: 11.4 g/dL — ABNORMAL LOW (ref 12.0–15.0)
MCH: 25.1 pg — ABNORMAL LOW (ref 26.0–34.0)
MCHC: 31.1 g/dL (ref 30.0–36.0)
MCV: 80.6 fL (ref 80.0–100.0)
Platelets: 429 10*3/uL — ABNORMAL HIGH (ref 150–400)
RBC: 4.54 MIL/uL (ref 3.87–5.11)
RDW: 19.5 % — ABNORMAL HIGH (ref 11.5–15.5)
WBC: 9.9 10*3/uL (ref 4.0–10.5)
nRBC: 0 % (ref 0.0–0.2)

## 2024-04-11 LAB — TROPONIN T, HIGH SENSITIVITY: Troponin T High Sensitivity: <15 ng/L (ref <19)

## 2024-04-11 LAB — PREGNANCY, URINE: Preg Test, Ur: NEGATIVE

## 2024-04-11 LAB — LIPASE, BLOOD: Lipase: 21 U/L (ref 11–51)

## 2024-04-11 MED ORDER — IOHEXOL 300 MG/ML  SOLN
100.0000 mL | Freq: Once | INTRAMUSCULAR | Status: AC | PRN
  Administered 2024-04-11: 100 mL via INTRAVENOUS

## 2024-04-11 MED ORDER — PANTOPRAZOLE SODIUM 20 MG PO TBEC: 20.0000 mg | DELAYED_RELEASE_TABLET | Freq: Every day | ORAL | 0 refills | Status: AC

## 2024-04-11 MED ORDER — HYDROCODONE-ACETAMINOPHEN 5-325 MG PO TABS: 2.0000 | ORAL_TABLET | ORAL | 0 refills | Status: AC | PRN

## 2024-04-11 NOTE — ED Triage Notes (Signed)
 Pt to ER with c/o LUQ abdominal pain for last week that has been progressing and is now radiating into left back. Pt reports mild nausea intermittently.  States pain is worse with movement and to touch.

## 2024-04-11 NOTE — ED Notes (Signed)
 Patient transported to x-ray. ?

## 2024-04-11 NOTE — ED Provider Notes (Signed)
 [place] EMERGENCY DEPARTMENT AT [facility] Provider Note   CSN: [id number] Arrival date & time: 04/11/24  1406     History  Chief Complaint  Patient presents with   Abdominal Pain    Lori Roy is a 38 y.o. female with a 3-day history of pain to the left upper quadrant.  She further states that she has epigastric pain that has been present over the same period of time.  Of note she had a previous cholecystectomy in 2014, does not state that her pain or discomfort is changed with eating, though she does endorse a reduced appetite over the last several days.  She currently states that her only medication that she takes is occasional Tylenol  as needed for pain, does not take any medications that might lead to gastroparesis.  She denies any shortness of breath at baseline however states that when she feels increased pain she feels shortness of breath as well.  She denies any vomiting though does have intermittent nausea.  She states that the pain is sometimes sharp sometimes dull but is persistent originating from the left hypochondrium and radiating through the left side of her back.  She notes that it is tender to touch in the area affected.  She does not have any exertional dyspnea.  She denies noting any dependent edema.  She states that when she is feeling her belly she feels that there may be something protruding from the abdomen.   Abdominal Pain Associated symptoms: chest pain   Associated symptoms: no constipation, no cough, no diarrhea, no dysuria, no fatigue, no fever and no shortness of breath    Previous medical diagnoses of migraine headaches, previous history of cholelithiasis, noted surgical history of cholecystectomy in 2014.    Home Medications Prior to Admission medications   Medication Sig Start Date End Date Taking? Authorizing Provider  HYDROcodone -acetaminophen  (NORCO/VICODIN) 5-325 MG tablet Take 2 tablets by mouth every 4 (four) hours as needed.  04/11/24  Yes Juanetta Nordmann, PA  pantoprazole  (PROTONIX ) 20 MG tablet Take 1 tablet (20 mg total) by mouth daily. 04/11/24  Yes Juanetta Nordmann, PA  busPIRone  (BUSPAR ) 10 MG tablet Take 1 tablet (10 mg total) by mouth 2 (two) times daily. Patient not taking: Reported on 09/26/2020 12/02/18   McClung, Angela M, PA-C  calcium carbonate (TUMS - DOSED IN MG ELEMENTAL CALCIUM) 500 MG chewable tablet Chew 1-2 tablets by mouth 2 (two) times daily as needed for indigestion or heartburn.  Patient not taking: Reported on 09/26/2020    [provider]  dicyclomine  (BENTYL ) 20 MG tablet Take 1 tablet (20 mg total) by mouth every 8 (eight) hours as needed for spasms (Abdominal cramping and pain). Patient not taking: Reported on 09/26/2020 08/25/19   Ward, Clover Dao, DO  gabapentin (NEURONTIN) 100 MG capsule  09/07/20   [provider]  hydrochlorothiazide  (HYDRODIURIL ) 25 MG tablet Take 1 tablet (25 mg total) by mouth daily. Patient not taking: Reported on 11/16/2018 06/13/17   Joy, Adolm Ahumada C, PA-C  ibuprofen  (ADVIL ,MOTRIN ) 800 MG tablet Take 1 tablet (800 mg total) by mouth every 8 (eight) hours. Patient not taking: Reported on 11/16/2018 03/29/17   Bovard-Stuckert, Irwin Manual, MD  iron  polysaccharides (NIFEREX) 150 MG capsule Take 1 capsule (150 mg total) by mouth 2 (two) times daily. 09/26/20   Frankie Israel, MD  methocarbamol  (ROBAXIN ) 500 MG tablet Take 1 tablet (500 mg total) by mouth 2 (two) times daily. Patient not taking: Reported on 09/26/2020 12/03/19  Everlyn Hockey, PA-C  naproxen  (NAPROSYN ) 500 MG tablet Take 1 tablet (500 mg total) by mouth 2 (two) times daily. Patient not taking: Reported on 09/26/2020 12/03/19   Kehrli, Kelsey F, PA-C  omeprazole (PRILOSEC) 40 MG capsule Take 40 mg by mouth daily as needed (for heartburn). Patient not taking: Reported on 09/26/2020    [provider]  ondansetron  (ZOFRAN  ODT) 4 MG disintegrating tablet Take 1 tablet (4 mg total)  by mouth every 6 (six) hours as needed. Patient not taking: Reported on 09/26/2020 08/25/19   Ward, Clover Dao, DO  Prenatal Vit-Fe Phos-FA-Omega (VITAFOL GUMMIES) 3.33-0.333-34.8 MG CHEW Chew by mouth. 09/06/20   [provider]  propranolol ER (INDERAL LA) 80 MG 24 hr capsule Take 80 mg by mouth daily. 09/07/20   [provider]  sertraline (ZOLOFT) 100 MG tablet Take 100 mg by mouth daily. Patient not taking: Reported on 09/26/2020    [provider]  sertraline (ZOLOFT) 50 MG tablet Take 50 mg by mouth daily. 09/07/20   [provider]  Vitamin D , Ergocalciferol , (DRISDOL ) 1.25 MG (50000 UT) CAPS capsule Take 1 capsule (50,000 Units total) by mouth every 7 (seven) days. 12/07/18   McClung, Angela M, PA-C      Allergies    Patient has no known allergies.    Review of Systems   Review of Systems  Constitutional:  Positive for appetite change. Negative for diaphoresis, fatigue, fever and unexpected weight change.       Decreased appetite for past 3 days  Respiratory:  Negative for cough, chest tightness and shortness of breath.   Cardiovascular:  Positive for chest pain. Negative for palpitations and leg swelling.  Gastrointestinal:  Positive for abdominal distention and abdominal pain. Negative for constipation and diarrhea.  Genitourinary:  Negative for difficulty urinating, dysuria and frequency.  Neurological:  Negative for dizziness and weakness.  All other systems reviewed and are negative.   Physical Exam Updated Vital Signs BP (!) 153/91   Pulse 67   Temp 98.4 F (36.9 C) (Oral)   Resp 18   Ht 5\' 1"  (1.549 m)   Wt 76.2 kg   LMP 04/03/2024   SpO2 100%   BMI 31.74 kg/m  Physical Exam Vitals and nursing note reviewed.  Constitutional:      General: She is not in acute distress.    Appearance: Normal appearance. She is well-developed and overweight. She is not ill-appearing.  HENT:     Head: Normocephalic and atraumatic.      Mouth/Throat:     Mouth: Mucous membranes are moist.     Pharynx: Oropharynx is clear.  Eyes:     Extraocular Movements: Extraocular movements intact.     Conjunctiva/sclera: Conjunctivae normal.     Pupils: Pupils are equal, round, and reactive to light.  Cardiovascular:     Rate and Rhythm: Normal rate and regular rhythm.     Pulses: Normal pulses.     Heart sounds: Normal heart sounds, S1 normal and S2 normal. No murmur heard.    No friction rub. No gallop.  Pulmonary:     Effort: Pulmonary effort is normal.     Breath sounds: Normal breath sounds and air entry.  Abdominal:     General: Abdomen is flat. Bowel sounds are normal. There is no distension or abdominal bruit.     Palpations: Abdomen is soft. There is no hepatomegaly or splenomegaly.     Tenderness: There is abdominal tenderness in the epigastric area  and left upper quadrant. There is no guarding. Negative signs include Murphy's sign and McBurney's sign.  Musculoskeletal:        General: Normal range of motion.     Cervical back: Normal range of motion and neck supple.     Right lower leg: No edema.     Left lower leg: No edema.  Skin:    General: Skin is warm and dry.     Capillary Refill: Capillary refill takes less than 2 seconds.  Neurological:     General: No focal deficit present.     Mental Status: She is alert. Mental status is at baseline.  Psychiatric:        Mood and Affect: Mood normal.        Behavior: Behavior is cooperative.     ED Results / Procedures / Treatments   Labs (all labs ordered are listed, but only abnormal results are displayed) Labs Reviewed  CBC - Abnormal; Notable for the following components:      Result Value   Hemoglobin 11.4 (*)    MCH 25.1 (*)    RDW 19.5 (*)    Platelets 429 (*)    All other components within normal limits  URINALYSIS, ROUTINE W REFLEX MICROSCOPIC - Abnormal; Notable for the following components:   APPearance HAZY (*)    Ketones, ur 40 (*)     Leukocytes,Ua TRACE (*)    All other components within normal limits  LIPASE, BLOOD  COMPREHENSIVE METABOLIC PANEL WITH GFR  PREGNANCY, URINE  TROPONIN T, HIGH SENSITIVITY    EKG None  Radiology CT ABDOMEN PELVIS W CONTRAST Result Date: 04/11/2024 CLINICAL DATA:  Left upper quadrant abdominal pain progressive for 1 week, radiating to left back, intermittent nausea EXAM: CT ABDOMEN AND PELVIS WITH CONTRAST TECHNIQUE: Multidetector CT imaging of the abdomen and pelvis was performed using the standard protocol following bolus administration of intravenous contrast. RADIATION DOSE REDUCTION: This exam was performed according to the departmental dose-optimization program which includes automated exposure control, adjustment of the mA and/or kV according to patient size and/or use of iterative reconstruction technique. CONTRAST:  OMNIPAQUE  IOHEXOL  300 MG/ML  SOLN COMPARISON:  04/11/2024, 05/15/2023 FINDINGS: Lower chest: No acute pleural or parenchymal lung disease. Hepatobiliary: No focal liver abnormality is seen. Status post cholecystectomy. No biliary dilatation. Pancreas: Unremarkable. No pancreatic ductal dilatation or surrounding inflammatory changes. Spleen: Normal in size without focal abnormality. Adrenals/Urinary Tract: Adrenal glands are unremarkable. Kidneys are normal, without renal calculi, focal lesion, or hydronephrosis. Bladder is unremarkable. Stomach/Bowel: No bowel obstruction or ileus. Normal appendix right lower quadrant. No bowel wall thickening or inflammatory change. Vascular/Lymphatic: No significant vascular findings are present. No enlarged abdominal or pelvic lymph nodes. Reproductive: Uterus and bilateral adnexa are unremarkable. Other: No free fluid or free intraperitoneal gas. No abdominal wall hernia. Musculoskeletal: No acute or destructive bony abnormalities. Reconstructed images demonstrate no additional findings. IMPRESSION: 1. No acute intra-abdominal or  intrapelvic process. Electronically Signed   By: Bobbye Burrow M.D.   On: 04/11/2024 17:41   DG Abd Acute W/Chest Result Date: 04/11/2024 CLINICAL DATA:  Left upper quadrant abdominal pain for 1 week, pain radiates to left back, intermittent nausea EXAM: DG ABDOMEN ACUTE WITH 1 VIEW CHEST COMPARISON:  05/15/2023 FINDINGS: Supine and upright frontal views of the abdomen and pelvis as well as an upright frontal view of the chest are obtained. Cardiac silhouette is unremarkable. No airspace disease, effusion, or pneumothorax. No evidence of bowel obstruction or ileus. No  significant fecal retention. No masses or abnormal calcifications. No free gas within the greater peritoneal sac. Prior cholecystectomy. No acute bony abnormalities. IMPRESSION: 1. No acute intrathoracic process. 2. Unremarkable bowel gas pattern. Electronically Signed   By: Bobbye Burrow M.D.   On: 04/11/2024 15:29    Procedures Procedures    Medications Ordered in ED Medications  iohexol  (OMNIPAQUE ) 300 MG/ML solution 100 mL (100 mLs Intravenous Contrast Given 04/11/24 1726)    ED Course/ Medical Decision Making/ A&P Clinical Course as of 04/11/24 1805  Mon Apr 11, 2024  1522 Urinalysis, Routine w reflex microscopic -Urine, Clean Catch(!) Noted, patient does not have any concerning symptoms such as dysuria, urinary frequency, or any other symptoms that would be indicative of UTI. [JG]  1751 Troponin T, High Sensitivity [JG]    Clinical Course User Index [JG] Juanetta Nordmann, PA                                 Medical Decision Making Amount and/or Complexity of Data Reviewed Labs: ordered. Decision-making details documented in ED Course. Radiology: ordered.  Risk Prescription drug management.   Medical Decision Making:   Nazareth D Fielder is a 38 y.o. female who presented to the ED today with left upper abdominal pain detailed above.     Complete initial physical exam performed, notably the patient  was  alert and oriented in no apparent distress.  Notably the abdomen is tender the left hypochondrium as well as the epigastric region.  Bowel sounds were normal.  Pulmonary cardiac auscultation unremarkable.  No dependent edema noted.     Reviewed and confirmed nursing documentation for past medical history, family history, social history.    Initial Assessment:   With the patient's presentation of left upper abdominal and epigastric pain, most likely diagnosis is gastrointestinal in nature. Other diagnoses were considered including (but not limited to) CAD/ACS, mesenteric ischemia, gastric ulceration, pneumoperitoneum. These are considered less likely due to history of present illness and physical exam findings.    Initial Plan:  Obtain KUB as unavailable CT at this site at this time.  Evaluate for free air in the abdomen as well as any acute structural or infectious intrathoracic pathology. Screening labs including CBC and Metabolic panel to evaluate for infectious or metabolic etiology of disease.  Urinalysis with reflex culture ordered to evaluate for UTI or relevant urologic/nephrologic pathology.  EKG and serial troponin to evaluate for cardiac pathology. Objective evaluation as below reviewed   Initial Study Results:   Laboratory  All laboratory results reviewed without evidence of clinically relevant pathology.   Exceptions include: Hemoglobin is 11.4 risk is consistent with previous readings, elevated platelets noted 429 which is also consistent with previous lab readings.  EKG EKG was reviewed independently. Rate, rhythm, axis, intervals all examined and without medically relevant abnormality. ST segments without concerns for elevations.    Radiology:  All images reviewed independently. Agree with radiology report at this time.   CT ABDOMEN PELVIS W CONTRAST Result Date: 04/11/2024 CLINICAL DATA:  Left upper quadrant abdominal pain progressive for 1 week, radiating to left back,  intermittent nausea EXAM: CT ABDOMEN AND PELVIS WITH CONTRAST TECHNIQUE: Multidetector CT imaging of the abdomen and pelvis was performed using the standard protocol following bolus administration of intravenous contrast. RADIATION DOSE REDUCTION: This exam was performed according to the departmental dose-optimization program which includes automated exposure control, adjustment of the mA and/or kV  according to patient size and/or use of iterative reconstruction technique. CONTRAST:  OMNIPAQUE  IOHEXOL  300 MG/ML  SOLN COMPARISON:  04/11/2024, 05/15/2023 FINDINGS: Lower chest: No acute pleural or parenchymal lung disease. Hepatobiliary: No focal liver abnormality is seen. Status post cholecystectomy. No biliary dilatation. Pancreas: Unremarkable. No pancreatic ductal dilatation or surrounding inflammatory changes. Spleen: Normal in size without focal abnormality. Adrenals/Urinary Tract: Adrenal glands are unremarkable. Kidneys are normal, without renal calculi, focal lesion, or hydronephrosis. Bladder is unremarkable. Stomach/Bowel: No bowel obstruction or ileus. Normal appendix right lower quadrant. No bowel wall thickening or inflammatory change. Vascular/Lymphatic: No significant vascular findings are present. No enlarged abdominal or pelvic lymph nodes. Reproductive: Uterus and bilateral adnexa are unremarkable. Other: No free fluid or free intraperitoneal gas. No abdominal wall hernia. Musculoskeletal: No acute or destructive bony abnormalities. Reconstructed images demonstrate no additional findings. IMPRESSION: 1. No acute intra-abdominal or intrapelvic process. Electronically Signed   By: Bobbye Burrow M.D.   On: 04/11/2024 17:41   DG Abd Acute W/Chest Result Date: 04/11/2024 CLINICAL DATA:  Left upper quadrant abdominal pain for 1 week, pain radiates to left back, intermittent nausea EXAM: DG ABDOMEN ACUTE WITH 1 VIEW CHEST COMPARISON:  05/15/2023 FINDINGS: Supine and upright frontal views of the  abdomen and pelvis as well as an upright frontal view of the chest are obtained. Cardiac silhouette is unremarkable. No airspace disease, effusion, or pneumothorax. No evidence of bowel obstruction or ileus. No significant fecal retention. No masses or abnormal calcifications. No free gas within the greater peritoneal sac. Prior cholecystectomy. No acute bony abnormalities. IMPRESSION: 1. No acute intrathoracic process. 2. Unremarkable bowel gas pattern. Electronically Signed   By: Bobbye Burrow M.D.   On: 04/11/2024 15:29     Reassessment and Plan:   Along with physical exam and clinical history, extensive workup does not show any acute processes at this time.  CT imaging does not demonstrate any acute pancreatitis, any acute bowel obstructions, serum lipase was unremarkable, and liver function testing does not show any acute elevations.  As such believe patient's current symptoms may be related to gastric ulcerations.  Will manage outpatient with p.o. pantoprazole , as well as providing her with a short course of Norco to manage acute pain as needed.  Further will refer for outpatient gastroenterology follow-up.  This was discussed thoroughly with the patient she understands and agrees and has no further concerns at this time.          Final Clinical Impression(s) / ED Diagnoses Final diagnoses:  None    Rx / DC Orders ED Discharge Orders          Ordered    pantoprazole  (PROTONIX ) 20 MG tablet  Daily        04/11/24 1805    HYDROcodone -acetaminophen  (NORCO/VICODIN) 5-325 MG tablet  Every 4 hours PRN        04/11/24 1805              Juanetta Nordmann, [place] 04/11/24 1808    Afton Horse T, DO 04/23/24 2301

## 2024-05-23 ENCOUNTER — Encounter: Payer: Self-pay | Admitting: General Practice

## 2024-06-06 ENCOUNTER — Encounter: Admitting: Family

## 2024-06-06 NOTE — Progress Notes (Signed)
 Erroneous encounter-disregard

## 2024-09-19 ENCOUNTER — Ambulatory Visit: Admitting: Family Medicine

## 2024-09-19 NOTE — Progress Notes (Deleted)
 New Patient Visit  Subjective:     Patient ID: Lori Roy, female    DOB: 08-30-1986, 38 y.o.   MRN: [id number]  No chief complaint on file.   HPI  Discussed the use of AI scribe software for clinical note transcription with the patient, who gave verbal consent to proceed.  History of Present Illness      ROS Per HPI  Outpatient Encounter Medications as of 09/19/2024  Medication Sig   busPIRone  (BUSPAR ) 10 MG tablet Take 1 tablet (10 mg total) by mouth 2 (two) times daily. (Patient not taking: Reported on 09/26/2020)   calcium carbonate (TUMS - DOSED IN MG ELEMENTAL CALCIUM) 500 MG chewable tablet Chew 1-2 tablets by mouth 2 (two) times daily as needed for indigestion or heartburn.  (Patient not taking: Reported on 09/26/2020)   dicyclomine  (BENTYL ) 20 MG tablet Take 1 tablet (20 mg total) by mouth every 8 (eight) hours as needed for spasms (Abdominal cramping and pain). (Patient not taking: Reported on 09/26/2020)   gabapentin (NEURONTIN) 100 MG capsule    hydrochlorothiazide  (HYDRODIURIL ) 25 MG tablet Take 1 tablet (25 mg total) by mouth daily. (Patient not taking: Reported on 11/16/2018)   HYDROcodone -acetaminophen  (NORCO/VICODIN) 5-325 MG tablet Take 2 tablets by mouth every 4 (four) hours as needed.   ibuprofen  (ADVIL ,MOTRIN ) 800 MG tablet Take 1 tablet (800 mg total) by mouth every 8 (eight) hours. (Patient not taking: Reported on 11/16/2018)   iron  polysaccharides (NIFEREX) 150 MG capsule Take 1 capsule (150 mg total) by mouth 2 (two) times daily.   methocarbamol  (ROBAXIN ) 500 MG tablet Take 1 tablet (500 mg total) by mouth 2 (two) times daily. (Patient not taking: Reported on 09/26/2020)   naproxen  (NAPROSYN ) 500 MG tablet Take 1 tablet (500 mg total) by mouth 2 (two) times daily. (Patient not taking: Reported on 09/26/2020)   omeprazole (PRILOSEC) 40 MG capsule Take 40 mg by mouth daily as needed (for heartburn). (Patient not taking: Reported on 09/26/2020)    ondansetron  (ZOFRAN  ODT) 4 MG disintegrating tablet Take 1 tablet (4 mg total) by mouth every 6 (six) hours as needed. (Patient not taking: Reported on 09/26/2020)   pantoprazole  (PROTONIX ) 20 MG tablet Take 1 tablet (20 mg total) by mouth daily.   Prenatal Vit-Fe Phos-FA-Omega (VITAFOL GUMMIES) 3.33-0.333-34.8 MG CHEW Chew by mouth.   propranolol ER (INDERAL LA) 80 MG 24 hr capsule Take 80 mg by mouth daily.   sertraline (ZOLOFT) 100 MG tablet Take 100 mg by mouth daily. (Patient not taking: Reported on 09/26/2020)   sertraline (ZOLOFT) 50 MG tablet Take 50 mg by mouth daily.   Vitamin D , Ergocalciferol , (DRISDOL ) 1.25 MG (50000 UT) CAPS capsule Take 1 capsule (50,000 Units total) by mouth every 7 (seven) days.   No facility-administered encounter medications on file as of 09/19/2024.    Past Medical History:  Diagnosis Date   Anxiety    Delivery of pregnancy by cesarean section 03/27/2017   Depression    Iron  deficiency anemia    Migraines    Normal pregnancy 07/12/2011   SROM (spontaneous rupture of membranes) 07/12/2011   SVD (spontaneous vaginal delivery) 07/13/2011    Past Surgical History:  Procedure Laterality Date   CESAREAN SECTION WITH BILATERAL TUBAL LIGATION N/A 03/27/2017   Procedure: CESAREAN SECTION WITH BILATERAL TUBAL LIGATION;  Surgeon: Danielle Rom, MD;  Location: [facility];  Service: Obstetrics;  Laterality: N/A;   CHOLECYSTECTOMY N/A 05/11/2013   Procedure: LAPAROSCOPIC CHOLECYSTECTOMY WITH INTRAOPERATIVE CHOLANGIOGRAM;  Surgeon:  Lynwood MALVA Pina, MD;  Location: [facility] OR;  Service: General;  Laterality: N/A;   WISDOM TOOTH EXTRACTION  ~ 2006    No family history on file.  Social History   Socioeconomic History   Marital status: Single    Spouse name: Not on file   Number of children: Not on file   Years of education: Not on file   Highest education level: Not on file  Occupational History   Not on file  Tobacco Use   Smoking status: Former     Current packs/day: 0.00    Average packs/day: 0.1 packs/day for 3.0 years (0.3 ttl pk-yrs)    Types: Cigarettes    Start date: 11/18/2007    Quit date: 11/17/2010    Years since quitting: 13.8   Smokeless tobacco: Never  Vaping Use   Vaping status: Never Used  Substance and Sexual Activity   Alcohol use: No   Drug use: Not Currently    Types: Marijuana    Comment: CBD/THC gummies   Sexual activity: Yes  Other Topics Concern   Not on file  Social History Narrative   Not on file   Social Drivers of Health   Financial Resource Strain: Not on file  Food Insecurity: Not on file  Transportation Needs: Not on file  Physical Activity: Not on file  Stress: Not on file  Social Connections: Not on file  Intimate Partner Violence: Not on file       Objective:    There were no vitals taken for this visit.   Physical Exam Vitals and nursing note reviewed.  Constitutional:      General: She is not in acute distress.    Appearance: Normal appearance. She is normal weight.  HENT:     Head: Normocephalic and atraumatic.     Right Ear: External ear normal.     Left Ear: External ear normal.     Nose: Nose normal.     Mouth/Throat:     Mouth: Mucous membranes are moist.     Pharynx: Oropharynx is clear.  Eyes:     Extraocular Movements: Extraocular movements intact.     Pupils: Pupils are equal, round, and reactive to light.  Cardiovascular:     Rate and Rhythm: Normal rate and regular rhythm.     Pulses: Normal pulses.     Heart sounds: Normal heart sounds.  Pulmonary:     Effort: Pulmonary effort is normal. No respiratory distress.     Breath sounds: Normal breath sounds. No wheezing, rhonchi or rales.  Musculoskeletal:        General: Normal range of motion.     Cervical back: Normal range of motion.     Right lower leg: No edema.     Left lower leg: No edema.  Lymphadenopathy:     Cervical: No cervical adenopathy.  Neurological:     General: No focal deficit present.      Mental Status: She is alert and oriented to person, place, and time.  Psychiatric:        Mood and Affect: Mood normal.        Thought Content: Thought content normal.     No results found for any visits on 09/19/24.      Assessment & Plan:   Assessment and Plan Assessment & Plan      No orders of the defined types were placed in this encounter.    No orders of the defined types were placed in  this encounter.   No follow-ups on file.  Corean LITTIE Ku, FNP

## 2024-11-19 ENCOUNTER — Emergency Department (HOSPITAL_COMMUNITY)

## 2024-11-19 ENCOUNTER — Emergency Department (HOSPITAL_COMMUNITY)
Admission: EM | Admit: 2024-11-19 | Discharge: 2024-11-20 | Attending: Emergency Medicine | Admitting: Emergency Medicine

## 2024-11-19 ENCOUNTER — Other Ambulatory Visit: Payer: Self-pay

## 2024-11-19 ENCOUNTER — Encounter (HOSPITAL_COMMUNITY): Payer: Self-pay | Admitting: *Deleted

## 2024-11-19 DIAGNOSIS — R1012 Left upper quadrant pain: Secondary | ICD-10-CM | POA: Diagnosis present

## 2024-11-19 DIAGNOSIS — Z5321 Procedure and treatment not carried out due to patient leaving prior to being seen by health care provider: Secondary | ICD-10-CM | POA: Insufficient documentation

## 2024-11-19 DIAGNOSIS — R079 Chest pain, unspecified: Secondary | ICD-10-CM | POA: Insufficient documentation

## 2024-11-19 DIAGNOSIS — R519 Headache, unspecified: Secondary | ICD-10-CM | POA: Insufficient documentation

## 2024-11-19 LAB — CBC WITH DIFFERENTIAL/PLATELET
Abs Immature Granulocytes: 0.02 K/uL (ref 0.00–0.07)
Basophils Absolute: 0.1 K/uL (ref 0.0–0.1)
Basophils Relative: 1 %
Eosinophils Absolute: 0.2 K/uL (ref 0.0–0.5)
Eosinophils Relative: 2 %
HCT: 38.3 % (ref 36.0–46.0)
Hemoglobin: 12.1 g/dL (ref 12.0–15.0)
Immature Granulocytes: 0 %
Lymphocytes Relative: 37 %
Lymphs Abs: 3.8 K/uL (ref 0.7–4.0)
MCH: 27.1 pg (ref 26.0–34.0)
MCHC: 31.6 g/dL (ref 30.0–36.0)
MCV: 85.7 fL (ref 80.0–100.0)
Monocytes Absolute: 0.4 K/uL (ref 0.1–1.0)
Monocytes Relative: 4 %
Neutro Abs: 5.6 K/uL (ref 1.7–7.7)
Neutrophils Relative %: 56 %
Platelets: 373 K/uL (ref 150–400)
RBC: 4.47 MIL/uL (ref 3.87–5.11)
RDW: 16 % — ABNORMAL HIGH (ref 11.5–15.5)
WBC: 10.1 K/uL (ref 4.0–10.5)
nRBC: 0 % (ref 0.0–0.2)

## 2024-11-19 LAB — COMPREHENSIVE METABOLIC PANEL WITH GFR
ALT: 13 U/L (ref 0–44)
AST: 17 U/L (ref 15–41)
Albumin: 4.4 g/dL (ref 3.5–5.0)
Alkaline Phosphatase: 68 U/L (ref 38–126)
Anion gap: 12 (ref 5–15)
BUN: 8 mg/dL (ref 6–20)
CO2: 22 mmol/L (ref 22–32)
Calcium: 9.3 mg/dL (ref 8.9–10.3)
Chloride: 105 mmol/L (ref 98–111)
Creatinine, Ser: 0.74 mg/dL (ref 0.44–1.00)
GFR, Estimated: >60 mL/min
Glucose, Bld: 90 mg/dL (ref 70–99)
Potassium: 3.6 mmol/L (ref 3.5–5.1)
Sodium: 139 mmol/L (ref 135–145)
Total Bilirubin: 0.3 mg/dL (ref 0.0–1.2)
Total Protein: 7.2 g/dL (ref 6.5–8.1)

## 2024-11-19 LAB — URINALYSIS, ROUTINE W REFLEX MICROSCOPIC
Bacteria, UA: NONE SEEN
Bilirubin Urine: NEGATIVE
Glucose, UA: NEGATIVE mg/dL
Ketones, ur: NEGATIVE mg/dL
Leukocytes,Ua: NEGATIVE
Nitrite: NEGATIVE
Protein, ur: NEGATIVE mg/dL
Specific Gravity, Urine: 1.025 (ref 1.005–1.030)
pH: 6 (ref 5.0–8.0)

## 2024-11-19 LAB — HCG, SERUM, QUALITATIVE: Preg, Serum: NEGATIVE

## 2024-11-19 LAB — TROPONIN T, HIGH SENSITIVITY
Troponin T High Sensitivity: <15 ng/L (ref 0–19)
Troponin T High Sensitivity: <15 ng/L (ref 0–19)

## 2024-11-19 LAB — LIPASE, BLOOD: Lipase: 30 U/L (ref 11–51)

## 2024-11-19 LAB — RESP PANEL BY RT-PCR (RSV, FLU A&B, COVID)  RVPGX2
Influenza A by PCR: NEGATIVE
Influenza B by PCR: NEGATIVE
Resp Syncytial Virus by PCR: NEGATIVE
SARS Coronavirus 2 by RT PCR: NEGATIVE

## 2024-11-19 NOTE — ED Provider Triage Note (Signed)
 Emergency Medicine Provider Triage Evaluation Note  Lori Roy , a 39 y.o. female  was evaluated in triage.  Pt complains of I do not feel right.  Reports that she has had some left upper quadrant abdominal pain radiate to the left flank for a couple months.  She reports she was originally treated for pyelonephritis which seemed to improve her symptoms, but they did not completely resolved.  Denies any dysuria, but does report some increased urinary frequency.  Also reports that she has developed some central chest pain for the past couple days.  Reports that at times she feels short of breath.  Reports some headaches as well.  Denies any neck pain or stiffness.  No fever or chills.  Denies any nausea or vomiting.  Review of Systems  Positive: As above Negative: As above  Physical Exam  BP (!) 180/126 (BP Location: Right Arm)   Pulse (!) 111   Temp 98.3 F (36.8 C)   Resp (!) 22   Ht 5' 1 (1.549 m)   Wt 76.2 kg   LMP 11/19/2024   SpO2 100%   BMI 31.74 kg/m  Gen:   Awake, no distress , appears very anxious Resp:  Normal effort  MSK:   Moves extremities without difficulty    Medical Decision Making  Medically screening exam initiated at 8:18 PM.  Appropriate orders placed.  Lori Roy was informed that the remainder of the evaluation will be completed by another provider, this initial triage assessment does not replace that evaluation, and the importance of remaining in the ED until their evaluation is complete.     Veta Palma, PA-C 11/19/24 2018

## 2024-11-19 NOTE — ED Triage Notes (Signed)
 The pt is c/o chest and abd pain with flank pain for one week no temp   nausea no vomiting  lmp now

## 2024-11-20 NOTE — ED Notes (Signed)
 Patient stated she is going to another hospital due to extremely long wait.

## 2024-11-21 ENCOUNTER — Other Ambulatory Visit: Payer: Self-pay | Admitting: Medical Genetics

## 2024-11-30 ENCOUNTER — Other Ambulatory Visit (HOSPITAL_COMMUNITY)

## 2024-12-08 ENCOUNTER — Ambulatory Visit: Admitting: Family Medicine

## 2024-12-08 NOTE — Patient Instructions (Incomplete)

## 2024-12-08 NOTE — Progress Notes (Unsigned)
 "  New Patient Visit  Subjective:     Patient ID: Lori Roy, female    DOB: 09-30-1986, 39 y.o.   MRN: [id number]  No chief complaint on file.   HPI  Discussed the use of AI scribe software for clinical note transcription with the patient, who gave verbal consent to proceed.  History of Present Illness      ROS Per HPI  Outpatient Encounter Medications as of 12/08/2024  Medication Sig   busPIRone  (BUSPAR ) 10 MG tablet Take 1 tablet (10 mg total) by mouth 2 (two) times daily. (Patient not taking: Reported on 09/26/2020)   calcium carbonate (TUMS - DOSED IN MG ELEMENTAL CALCIUM) 500 MG chewable tablet Chew 1-2 tablets by mouth 2 (two) times daily as needed for indigestion or heartburn.  (Patient not taking: Reported on 09/26/2020)   dicyclomine  (BENTYL ) 20 MG tablet Take 1 tablet (20 mg total) by mouth every 8 (eight) hours as needed for spasms (Abdominal cramping and pain). (Patient not taking: Reported on 09/26/2020)   gabapentin (NEURONTIN) 100 MG capsule    hydrochlorothiazide  (HYDRODIURIL ) 25 MG tablet Take 1 tablet (25 mg total) by mouth daily. (Patient not taking: Reported on 11/16/2018)   HYDROcodone -acetaminophen  (NORCO/VICODIN) 5-325 MG tablet Take 2 tablets by mouth every 4 (four) hours as needed.   ibuprofen  (ADVIL ,MOTRIN ) 800 MG tablet Take 1 tablet (800 mg total) by mouth every 8 (eight) hours. (Patient not taking: Reported on 11/16/2018)   iron  polysaccharides (NIFEREX) 150 MG capsule Take 1 capsule (150 mg total) by mouth 2 (two) times daily.   methocarbamol  (ROBAXIN ) 500 MG tablet Take 1 tablet (500 mg total) by mouth 2 (two) times daily. (Patient not taking: Reported on 09/26/2020)   naproxen  (NAPROSYN ) 500 MG tablet Take 1 tablet (500 mg total) by mouth 2 (two) times daily. (Patient not taking: Reported on 09/26/2020)   omeprazole (PRILOSEC) 40 MG capsule Take 40 mg by mouth daily as needed (for heartburn). (Patient not taking: Reported on  09/26/2020)   ondansetron  (ZOFRAN  ODT) 4 MG disintegrating tablet Take 1 tablet (4 mg total) by mouth every 6 (six) hours as needed. (Patient not taking: Reported on 09/26/2020)   pantoprazole  (PROTONIX ) 20 MG tablet Take 1 tablet (20 mg total) by mouth daily.   Prenatal Vit-Fe Phos-FA-Omega (VITAFOL GUMMIES) 3.33-0.333-34.8 MG CHEW Chew by mouth.   propranolol ER (INDERAL LA) 80 MG 24 hr capsule Take 80 mg by mouth daily.   sertraline (ZOLOFT) 100 MG tablet Take 100 mg by mouth daily. (Patient not taking: Reported on 09/26/2020)   sertraline (ZOLOFT) 50 MG tablet Take 50 mg by mouth daily.   Vitamin D , Ergocalciferol , (DRISDOL ) 1.25 MG (50000 UT) CAPS capsule Take 1 capsule (50,000 Units total) by mouth every 7 (seven) days.   No facility-administered encounter medications on file as of 12/08/2024.    Past Medical History:  Diagnosis Date   Anxiety    Delivery of pregnancy by cesarean section 03/27/2017   Depression    Iron  deficiency anemia    Migraines    Normal pregnancy 07/12/2011   SROM (spontaneous rupture of membranes) 07/12/2011   SVD (spontaneous vaginal delivery) 07/13/2011    Past Surgical History:  Procedure Laterality Date   CESAREAN SECTION WITH BILATERAL TUBAL LIGATION N/A 03/27/2017   Procedure: CESAREAN SECTION WITH BILATERAL TUBAL LIGATION;  Surgeon: Danielle Rom, MD;  Location: [facility];  Service: Obstetrics;  Laterality: N/A;   CHOLECYSTECTOMY N/A 05/11/2013   Procedure: LAPAROSCOPIC CHOLECYSTECTOMY WITH INTRAOPERATIVE CHOLANGIOGRAM;  Surgeon: Lynwood MALVA Pina, MD;  Location: [facility] OR;  Service: General;  Laterality: N/A;   WISDOM TOOTH EXTRACTION  ~ 2006    No family history on file.  Social History   Socioeconomic History   Marital status: Single    Spouse name: Not on file   Number of children: Not on file   Years of education: Not on file   Highest education level: Some college, no degree  Occupational History   Not on  file  Tobacco Use   Smoking status: Former    Current packs/day: 0.00    Average packs/day: 0.1 packs/day for 3.0 years (0.3 ttl pk-yrs)    Types: Cigarettes    Start date: 11/18/2007    Quit date: 11/17/2010    Years since quitting: 14.0   Smokeless tobacco: Never  Vaping Use   Vaping status: Never Used  Substance and Sexual Activity   Alcohol use: No   Drug use: Not Currently    Types: Marijuana    Comment: CBD/THC gummies   Sexual activity: Yes  Other Topics Concern   Not on file  Social History Narrative   Not on file   Social Drivers of Health   Tobacco Use: Low Risk (11/25/2024)   Received from [facility]   Patient History    Smoking Tobacco Use: Never    Smokeless Tobacco Use: Never    Passive Exposure: Not on file  Recent Concern: Tobacco Use - Medium Risk (11/19/2024)   Patient History    Smoking Tobacco Use: Former    Smokeless Tobacco Use: Never    Passive Exposure: Not on file  Financial Resource Strain: Medium Risk (12/04/2024)   Overall Financial Resource Strain (CARDIA)    Difficulty of Paying Living Expenses: Somewhat hard  Food Insecurity: No Food Insecurity (12/04/2024)   Epic    Worried About Programme Researcher, Broadcasting/film/video in the Last Year: Never true    Ran Out of Food in the Last Year: Never true  Transportation Needs: No Transportation Needs (12/04/2024)   Epic    Lack of Transportation (Medical): No    Lack of Transportation (Non-Medical): No  Physical Activity: Insufficiently Active (12/04/2024)   Exercise Vital Sign    Days of Exercise per Week: 2 days    Minutes of Exercise per Session: 20 min  Stress: Stress Concern Present (12/04/2024)   [employer] of Occupational Health - Occupational Stress Questionnaire    Feeling of Stress: Very much  Social Connections: Socially Isolated (12/04/2024)   Social Connection and Isolation Panel    Frequency of Communication with Friends and Family: More than three times a week    Frequency  of Social Gatherings with Friends and Family: More than three times a week    Attends Religious Services: Never    Database Administrator or Organizations: No    Attends Engineer, Structural: Not on file    Marital Status: Divorced  Intimate Partner Violence: Not At Risk (11/23/2024)   Received from [facility]   HITS    Over the last 12 months how often did your partner physically hurt you?: Never    Over the last 12 months how often did your partner insult you or talk down to you?: Never    Over the last 12 months how often did your partner threaten you with physical harm?: Never    Over the last 12 months how often did your partner scream or curse at you?: Never  Depression (  EYV7-0): Not on file  Alcohol Screen: Low Risk (12/04/2024)   Alcohol Screen    Last Alcohol Screening Score (AUDIT): 1  Housing: Low Risk (12/04/2024)   Epic    Unable to Pay for Housing in the Last Year: No    Number of Times Moved in the Last Year: 0    Homeless in the Last Year: No  Utilities: At Risk (09/06/2024)   Received from [facility]    In the past 12 months has the electric, gas, oil, or water company threatened to shut off services in your home?: Yes  Health Literacy: Not on file       Objective:    LMP 11/19/2024    Physical Exam Vitals and nursing note reviewed.  Constitutional:      General: She is not in acute distress.    Appearance: Normal appearance. She is normal weight.  HENT:     Head: Normocephalic and atraumatic.     Right Ear: External ear normal.     Left Ear: External ear normal.     Nose: Nose normal.     Mouth/Throat:     Mouth: Mucous membranes are moist.     Pharynx: Oropharynx is clear.  Eyes:     Extraocular Movements: Extraocular movements intact.     Pupils: Pupils are equal, round, and reactive to light.  Cardiovascular:     Rate and Rhythm: Normal rate and regular rhythm.     Pulses: Normal pulses.     Heart sounds: Normal  heart sounds.  Pulmonary:     Effort: Pulmonary effort is normal. No respiratory distress.     Breath sounds: Normal breath sounds. No wheezing, rhonchi or rales.  Musculoskeletal:        General: Normal range of motion.     Cervical back: Normal range of motion.     Right lower leg: No edema.     Left lower leg: No edema.  Lymphadenopathy:     Cervical: No cervical adenopathy.  Neurological:     General: No focal deficit present.     Mental Status: She is alert and oriented to person, place, and time.  Psychiatric:        Mood and Affect: Mood normal.        Thought Content: Thought content normal.    No results found for any visits on 12/08/24.      Assessment & Plan:   Assessment and Plan Assessment & Plan      No orders of the defined types were placed in this encounter.    No orders of the defined types were placed in this encounter.   No follow-ups on file.  Corean LITTIE Ku, FNP   "

## 2024-12-09 ENCOUNTER — Other Ambulatory Visit (HOSPITAL_COMMUNITY)

## 2025-01-12 ENCOUNTER — Ambulatory Visit: Admitting: Family Medicine

## 2025-01-13 ENCOUNTER — Other Ambulatory Visit (HOSPITAL_COMMUNITY)

## 2025-02-09 ENCOUNTER — Ambulatory Visit: Admitting: Nurse Practitioner
